# Patient Record
Sex: Female | Born: 1950 | Race: White | Hispanic: No | Marital: Single | Smoking: Current every day smoker
Health system: Southern US, Community
[De-identification: ages and names within clinical notes are randomized; demographics above are authoritative.]

## PROBLEM LIST (undated history)

## (undated) DIAGNOSIS — I10 Essential (primary) hypertension: Secondary | ICD-10-CM

## (undated) DIAGNOSIS — F329 Major depressive disorder, single episode, unspecified: Secondary | ICD-10-CM

## (undated) DIAGNOSIS — R569 Unspecified convulsions: Secondary | ICD-10-CM

## (undated) DIAGNOSIS — F419 Anxiety disorder, unspecified: Secondary | ICD-10-CM

## (undated) DIAGNOSIS — I639 Cerebral infarction, unspecified: Secondary | ICD-10-CM

## (undated) DIAGNOSIS — I509 Heart failure, unspecified: Secondary | ICD-10-CM

## (undated) DIAGNOSIS — J449 Chronic obstructive pulmonary disease, unspecified: Secondary | ICD-10-CM

## (undated) DIAGNOSIS — F32A Depression, unspecified: Secondary | ICD-10-CM

## (undated) HISTORY — PX: ABDOMINAL HYSTERECTOMY: SHX81

## (undated) HISTORY — PX: CHOLECYSTECTOMY: SHX55

---

## 1998-08-08 ENCOUNTER — Inpatient Hospital Stay (HOSPITAL_COMMUNITY): Admission: EM | Admit: 1998-08-08 | Discharge: 1998-08-09 | Payer: Self-pay | Admitting: Emergency Medicine

## 1998-11-11 ENCOUNTER — Ambulatory Visit (HOSPITAL_COMMUNITY): Admission: RE | Admit: 1998-11-11 | Discharge: 1998-11-11 | Payer: Self-pay | Admitting: Gastroenterology

## 1998-12-21 ENCOUNTER — Inpatient Hospital Stay (HOSPITAL_COMMUNITY): Admission: EM | Admit: 1998-12-21 | Discharge: 1998-12-23 | Payer: Self-pay

## 1998-12-29 ENCOUNTER — Encounter: Admission: RE | Admit: 1998-12-29 | Discharge: 1998-12-29 | Payer: Self-pay | Admitting: *Deleted

## 1999-06-02 ENCOUNTER — Encounter: Payer: Self-pay | Admitting: Emergency Medicine

## 1999-06-02 ENCOUNTER — Inpatient Hospital Stay (HOSPITAL_COMMUNITY): Admission: EM | Admit: 1999-06-02 | Discharge: 1999-06-08 | Payer: Self-pay | Admitting: Emergency Medicine

## 1999-06-06 ENCOUNTER — Encounter: Payer: Self-pay | Admitting: Cardiology

## 1999-08-08 ENCOUNTER — Encounter (HOSPITAL_COMMUNITY): Admission: RE | Admit: 1999-08-08 | Discharge: 1999-11-06 | Payer: Self-pay | Admitting: Cardiology

## 2000-06-06 ENCOUNTER — Encounter: Payer: Self-pay | Admitting: Cardiology

## 2000-06-06 ENCOUNTER — Inpatient Hospital Stay (HOSPITAL_COMMUNITY): Admission: EM | Admit: 2000-06-06 | Discharge: 2000-06-08 | Payer: Self-pay

## 2000-06-27 ENCOUNTER — Inpatient Hospital Stay (HOSPITAL_COMMUNITY): Admission: EM | Admit: 2000-06-27 | Discharge: 2000-07-02 | Payer: Self-pay | Admitting: Emergency Medicine

## 2000-06-27 ENCOUNTER — Encounter (INDEPENDENT_AMBULATORY_CARE_PROVIDER_SITE_OTHER): Payer: Self-pay | Admitting: *Deleted

## 2000-06-28 ENCOUNTER — Encounter: Payer: Self-pay | Admitting: Cardiovascular Disease

## 2000-06-29 ENCOUNTER — Encounter: Payer: Self-pay | Admitting: Cardiovascular Disease

## 2000-06-30 ENCOUNTER — Encounter: Payer: Self-pay | Admitting: Emergency Medicine

## 2000-07-24 ENCOUNTER — Emergency Department (HOSPITAL_COMMUNITY): Admission: EM | Admit: 2000-07-24 | Discharge: 2000-07-24 | Payer: Self-pay | Admitting: Emergency Medicine

## 2000-07-25 ENCOUNTER — Encounter: Payer: Self-pay | Admitting: Emergency Medicine

## 2000-07-25 ENCOUNTER — Inpatient Hospital Stay (HOSPITAL_COMMUNITY): Admission: EM | Admit: 2000-07-25 | Discharge: 2000-07-31 | Payer: Self-pay | Admitting: Endocrinology

## 2007-03-13 ENCOUNTER — Ambulatory Visit: Payer: Self-pay | Admitting: Thoracic Surgery (Cardiothoracic Vascular Surgery)

## 2007-04-30 ENCOUNTER — Ambulatory Visit: Payer: Self-pay | Admitting: Thoracic Surgery (Cardiothoracic Vascular Surgery)

## 2007-07-23 ENCOUNTER — Ambulatory Visit: Payer: Self-pay | Admitting: Thoracic Surgery (Cardiothoracic Vascular Surgery)

## 2007-08-26 ENCOUNTER — Ambulatory Visit: Payer: Self-pay | Admitting: Thoracic Surgery (Cardiothoracic Vascular Surgery)

## 2007-10-08 ENCOUNTER — Ambulatory Visit: Payer: Self-pay | Admitting: Thoracic Surgery (Cardiothoracic Vascular Surgery)

## 2008-11-22 ENCOUNTER — Inpatient Hospital Stay: Payer: Self-pay | Admitting: Internal Medicine

## 2010-10-12 ENCOUNTER — Ambulatory Visit: Payer: Self-pay | Admitting: Vascular Surgery

## 2010-10-20 ENCOUNTER — Ambulatory Visit (HOSPITAL_COMMUNITY): Admission: RE | Admit: 2010-10-20 | Discharge: 2010-10-20 | Payer: Self-pay | Admitting: Vascular Surgery

## 2010-10-20 ENCOUNTER — Ambulatory Visit: Payer: Self-pay | Admitting: Vascular Surgery

## 2010-12-07 ENCOUNTER — Ambulatory Visit: Payer: Self-pay | Admitting: Vascular Surgery

## 2011-03-14 LAB — POCT I-STAT, CHEM 8
BUN: 9 mg/dL (ref 6–23)
Chloride: 105 mEq/L (ref 96–112)
HCT: 39 % (ref 36.0–46.0)
Sodium: 141 mEq/L (ref 135–145)
TCO2: 28 mmol/L (ref 0–100)

## 2011-03-14 LAB — GLUCOSE, CAPILLARY
Glucose-Capillary: 102 mg/dL — ABNORMAL HIGH (ref 70–99)
Glucose-Capillary: 109 mg/dL — ABNORMAL HIGH (ref 70–99)
Glucose-Capillary: 110 mg/dL — ABNORMAL HIGH (ref 70–99)

## 2011-05-15 NOTE — Assessment & Plan Note (Signed)
OFFICE VISIT   SHAKILA, MAK K  DOB:  05-Jul-1951                                       10/12/2010  EAVWU#:[id number]   CHIEF COMPLAINT:  Leg pain.   HISTORY OF PRESENT ILLNESS:  The patient is a 60 year old female  referred by [facility] for evaluation of lower extremity  pain and weakness.  The patient states that her legs burn and give way  after walking approximately 20-30 yards.  She states this has been going  on for several years but slowly getting worse.  She also has noticed  some swelling in her right ankle which has occurred over the last few  months.  She currently smokes half a pack of cigarettes per day and is  not interested in quitting.  Other chronic medical problems include  diabetes, hypertension, elevated cholesterol, congestive failure and  COPD.  These are all chronic in nature and are currently controlled.  She does use oxygen at night time.  She denies any rest pain in her  foot.  She has no history of nonhealing ulcers in her feet.   PAST MEDICAL HISTORY:  Is otherwise noncontributory except for the fact  she has had multiple coronary stents.   PAST SURGICAL HISTORY:  She had a cholecystectomy.  She had an aortic  valve replacement with Dr. Arvilla Market at [facility].  She has also had a left  carotid stent that was placed 6-7 years ago and had a TIA event after  that.   SOCIAL HISTORY:  She is single.  Smoking history is listed above.  She  does not consume alcohol regularly.   FAMILY HISTORY:  Remarkable for her brother and sister, all of which  required aortic valve replacement and have had coronary stents.   REVIEW OF SYSTEMS:  Full 12 point review of systems was performed on the  patient today.  Please see intake referral form for details regarding  this.  To highlight though she has had several mini strokes in the past,  primarily involving her right side.  She also has a history of chest  pain and shortness of  breath.   MEDICATIONS:  Her medications include aspirin, Crestor, metformin,  Plavix, Nitrostat, Cartia, Dilantin, Trilipix, Nexium, gabapentin,  hydrocodone, Seroquel, diazepam and Cymbalta.   ALLERGIES:  She has allergies listed to penicillin and sulfa.   PHYSICAL EXAM:  Vital signs:  Blood pressure is 181/96 in the left arm,  heart rate is 93 and regular.  Temperature is 98.  HEENT:  Unremarkable.  Neck:  Has 2+ carotid pulses without bruit.  Chest:  Clear to  auscultation.  Cardiac:  Regular rate and rhythm without murmur.  Abdomen:  Soft, nontender, nondistended.  No masses.  Extremities: She  has 2+ radial pulses bilaterally.  She has a 1+ right femoral pulse.  She has absent left femoral pulse.  She has absent popliteal and pedal  pulses bilaterally.  Skin:  Has no open ulcers or rashes.  Neurologic:  Shows symmetric upper extremity and lower extremity motor strength which  is 5/5.  Musculoskeletal:  Exam shows no obvious major joint  deformities.  She does have some lateral swelling over the right ankle.   I reviewed several pages of medical records from [facility].  She has a history of lumbar radiculopathy and is on hydrocodone for  this  chronically.  She also has a history of peripheral neuropathy as well as  a seizure disorder and carotid disease with her most recent carotid  duplex exam in August of 2011 which showed no significant carotid  stenosis with a patent stent.  She did have suggestion of left  subclavian stenosis as well.   She had bilateral ABIs performed in our office today which were 0.73 on  the left, 0.94 on the right.  Waveforms were monophasic throughout the  left lower extremity suggesting iliac occlusive disease.   In summary, the patient has symptoms consistent with peripheral arterial  disease in both lower extremities with claudication type symptoms.  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  I had a lengthy  discussion with the patient today regarding smoking cessation.  She has  no interest in quitting at this time.  I also discussed with her the  risks of smoking combined with diabetes and again she is not interested  in quitting.  I have offered her an arteriogram with lower extremity  runoff to see if there is a percutaneous option for treating her iliac  occlusive disease to see if we could relieve her symptoms somewhat.  I  did advise her that she currently is not at risk of limb loss and we  could treat this conservatively.  However, she states that this is  lifestyle limiting enough that she wishes to pursue treatment at this  time.  We have scheduled her for arteriogram on 10/20/2010.  Risks,  benefits, possible complications and procedure details were explained to  the patient today including but not limited to bleeding, infection,  contrast reaction, vessel injury, also durability of any intervention  was described to the patient today.  I also described to her that the  durability may be further limited if she continues to smoke.  If this is  not amenable to percutaneous option in light of the fact that she has  fairly severe COPD I do not believe she would necessarily be a very good  operative candidate.  Hopefully we will be able to find a percutaneous  option for her.  She will continue her Plavix and her aspirin.  We will  stop her metformin before the procedure to make sure that she has no  reaction from the contrast interacting with this.     Janetta Hora. Fields, MD  Electronically Signed   CEF/MEDQ  D:  10/12/2010  T:  10/13/2010  Job:  [id number]   cc:   Ephriam Knuckles, PAC  Dani Gobble, [facility]  Aletha Halim

## 2011-05-15 NOTE — Assessment & Plan Note (Signed)
OFFICE VISIT   Lori Cooke, Lori Cooke  DOB:  1951/12/26                                       11/09/2010  OVFIE#:[id number]   The patient was scheduled for followup visit today after a recent  aortogram lower extremity runoff.  She was a no show for her office  visit today.     Janetta Hora. Fields, MD   CEF/MEDQ  D:  11/09/2010  T:  11/10/2010  Job:  [id number]

## 2011-05-15 NOTE — Procedures (Signed)
LOWER EXTREMITY ARTERIAL DUPLEX   INDICATION:  Claudication, per standing order.   HISTORY:  Diabetes:  Yes.  Cardiac:  CHF.  Hypertension:  Yes.  Smoking:  Yes.  Previous Surgery:  No.  Other:  Patient states that she gets left groin pain when she starts  walking.   SINGLE LEVEL ARTERIAL EXAM                          RIGHT                LEFT  Brachial:               161                  163  Anterior tibial:        144                  115  Posterior tibial:       153                  119  Peroneal:  Ankle/Brachial Index:   0.94                 0.73   LOWER EXTREMITY ARTERIAL DUPLEX EXAM   DUPLEX:  Monophasic Doppler waveforms noted throughout the left lower  extremity arterial system with no significantly increased velocities.   IMPRESSION:  1. Monophasic Doppler waveforms of the left lower extremity suggest      arterial disease at the left iliac artery level.  2. Moderately decreased left ankle brachial index with mildly      decreased right ankle brachial index.   ___________________________________________  Janetta Hora Fields, MD   CH/MEDQ  D:  10/12/2010  T:  10/12/2010  Job:  [id number]

## 2011-05-15 NOTE — Assessment & Plan Note (Signed)
OFFICE VISIT   Lori Cooke, Lori Cooke  DOB:  12-04-1951                                       11/09/2010  AOZHY#:[id number]   The patient was scheduled for followup visit today after a recent  aortogram lower extremity runoff.  She was a no show for her office  visit today.     Janetta Hora. Fields, MD  Electronically Signed   CEF/MEDQ  D:  11/09/2010  T:  11/10/2010  Job:  [phone]

## 2011-05-18 NOTE — H&P (Signed)
[facility]  Patient:    Lori Cooke, Lori Cooke                       MRN: [id number] Adm. Date:  [id number] Disc. Date: [id number] Attending:  Sandi Raveling                         History and Physical  REASON FOR ADMISSION:  Intractable vomiting.  HISTORY OF PRESENT ILLNESS:  The patient is a 60 year old woman who had a cholecystectomy one month ago.  She states that she has had persistent diarrhea since then.  She was seen in the Emergency Room of [facility] yesterday with abdominal pain as well as nausea and vomiting.  At that time, CBC was normal. CT of the abdomen showed a question of a cecal abnormality, and cholecystectomy was advised on a routine basis for follow-up.  The patient received parental antinausea medication, but it is the patients report that this did not improve her symptoms at all and she continued to vomit.  PAST MEDICAL HISTORY: 1. Elevated triglycerides. 2. Hypertension. 3. Cigarette smoking. 4. Anxiety. 5. Noncardiogenic chest pain, January 1999.  She had a normal Cardiolite with     exertion at that time. 6. The patient has a history of ectasia of the aorta.  MEDICATIONS: 1. Aspirin 81 mg daily. 2. Prevacid 30 mg twice a day. 3. Paxil 20 mg a day. 4. Lopressor 50 mg twice a day. 5. Lopid 600 mg twice a day.  FAMILY HISTORY:  No one else at home is ill.  SOCIAL HISTORY:  The patient is disabled.  She lives in [place].  She is single.  REVIEW OF SYSTEMS:  The patient denies the following:  Fever, loss of consciousness, shortness of breath, chest pain, bright red blood per rectum, jaundice, dysuria, hematuria, arthralgias, and skin rash.  It should be noted that the patient also had some epigastric pain and is not certain if this can be distinguished from chest pain.  PHYSICAL EXAMINATION:  VITAL SIGNS:  Blood pressure 120/70, heart rate is 88, temperature 98.3.  GENERAL:  In moderate distress secondary  to vomiting.  SKIN:  No diaphoretic.  There is no rash.  HEENT:  The head is atraumatic.  Sclerae nonicteric.  Pharynx is clear.  NECK:  Supple.  CHEST:  Clear to auscultation except for a few diffuse wheezes.  CARDIOVASCULAR:  No JVD.  No edema.  Regular rate and rhythm.  No murmur.  ABDOMEN:  Soft, minimal diffuse tenderness.  Not distended.  Bowel sounds are active.  RECTAL:  Examination not repeated today, as the patient reports it was hemoccult negative last night in the Emergency Department at [facility].  EXTREMITIES:   No obvious deformities of the joints.  NEUROLOGICAL:  Alert, well oriented.  Moves all fours and cranial nerves are grossly intact.  IMPRESSION: 1. Intractable nausea and vomiting. 2. Diarrhea since surgery one month ago and Clostridium difficile must    be excluded.  PLAN: 1. Admit to [facility]. 2. Check stool for Clostridium difficile. 3. Symptomatic therapy for nausea. 4. Electrocardiogram. 5. Recheck laboratory studies. 6. Full code at the patients request, but the patient states she would not    want to be maintained on artificial life support systems if there was no    reasonable chance of a functional recovery. DD:  07/25/00 TD:  07/27/00 Job: [id number]

## 2011-05-18 NOTE — Consult Note (Signed)
[facility]  Patient:    Lori Cooke, Lori Cooke                       MRN: [id number] Proc. Date: 07/27/00 Adm. Date:  [id number] Disc. Date: [id number] Attending:  Sandi Raveling CC:         Titus Dubin. Alwyn Ren, M.D. [facility]             Jimmye Norman, M.D.             [name], M.D.                          Consultation Report  DATE OF BIRTH:  July 01, 1951  REASON FOR CONSULTATION:  Abdominal pain and bloody diarrhea.  ASSESSMENT: 1. Right lower quadrant periumbilical pain with bloody diarrhea improved on    Cipro or Flagyl.  Question infectious versus inflammatory colitis. 2. Atherosclerotic coronary artery disease. 3. History of a hiatal hernia with gastritis diagnosed on    esophagogastroduodenoscopy by Dr. Melvia Heaps done last year. 4. Status post cholecystectomy on June 26, 2000 by Dr. Jimmye Norman. 5. Status post hysterectomy for uterine prolapse 20 years ago. 6. Three normal vaginal deliveries ______ . 7. History of tobacco abuse. 8. Family history of colon cancer (father died of colon cancer at 96).  The    patient gives a history of having colonoscopy done last year, question    results. 9. History of depression.  RECOMMENDATIONS: 1. Agree with IV antibiotics for now. 2. The patient may require repeat colonoscopy if there was not adequate    visualization of the right colon on the colonoscopy done last year. 3. Avoid all nonsteroidals for now. 4. Advance to soft, low residue diet for now.  DISCUSSION:  The patient is a 60 year old white female with the above mentioned problems who presented to [facility] two days ago with blood diarrhea, averaging 7-8 loose, bloody bowel movements per day and right lower quadrant pain.  This is the first time she has had symptoms like this. She had cholecystectomy last month for what she describes as chest pain.  She said the chest pain symptoms have improved since the cholecystectomy,  but cannot tell me for sure whether she had gallstones or biliary dyskinesia.  She had a CT scan of the abdomen and pelvis on admission that showed some thickening of the cecum and the ascending colon with a question of infectious versus inflammatory colitis.  The patient was started on IV Cipro and Flagyl and seems to have greatly improved on this medication.  She had 6-7 loose bowel movements yesterday, but none since last night and this morning.  Her abdominal pain is also considerably improved.  She denies a history of ulcer, jaundice or colitis.  Her appetite is somewhat decreased.  There is no nausea or vomiting.  She denies any genitourinary or cardiorespiratory complaints at this time.  There are no musculoskeletal or vascular problems.  The patient denies a history of recent travel, antibiotic use or use of well water.  ALLERGIES:  No known drug allergies.  MEDICATIONS: 1. Protonix 40 mg q.d. 2. Paxil 20 mg q.d. 3. Lopressor 50 mg b.i.d. 4. Flagyl 500 mg b.i.d. 5. Cipro 400 mg IV q.12h. 6. Phenergan p.r.n.  PAST MEDICAL HISTORY:  See the list above.  SOCIAL HISTORY:  The patient is divorced.  She is a Child psychotherapist.  She is now on  disability for the above mentioned problems.  She is a resident of [address] She has three grown children.  She has been smoking since the age of 43 and averages 1 to 1-1/2 packs per day for several years.  She drinks alcohol occasionally, but denies the use of street drugs.  FAMILY HISTORY:  The patients mother did of congestive heart failure at 54. Her father died of colon cancer at 59.  He was diagnosed at 73.  She has three brothers and two sisters.  There is a strong history of hypertension, heart disease and diabetes in their family.  Her oldest brother died of an MI at 80. She has a brother who has had several MIs in the past.  She has two sisters with heart disease.  There is no family history of breast, ovarian, uterine or prostate  cancer.  PHYSICAL EXAMINATION:  GENERAL:  Very pleasant, cooperative, middle-aged female in no acute distress.  VITAL SIGNS:  Stable.  She is afebrile.  HEENT:  TMs clear bilaterally.  ______ .  NECK:  Supple.  No JVD, thyromegaly or lymphadenopathy.  CHEST:  Clear to auscultation.  S1 and S2 regular.  Probable systolic murmur present.  No rales, rhonchi or wheezing.  ABDOMEN:  Soft with well healed surgical scars present from recent cholecystectomy and previous hysterectomy.  There is right lower quadrant tenderness on palpation with guarding.  No rebound or rigidity or hepatosplenomegaly.  No masses palpable.  RECTAL:  Deferred, as the patient already had one done by the ER physician.  LABORATORY DATA:  White count 8000, hemoglobin 12.6 g/dl, hematocrit 24.4, MCV 01.0, platelets 224.  Sodium 143, potassium 4.6, chloride 106, CO2 26, glucose 138, BUN 9, creatinine 0.9, calcium 9.8, total protein 6, albumin 3.4,  AST 21, ALT 14, alkaline phosphatase 79, total bilirubin 0.7, direct bilirubin 0.1.  CK-MBs were normal.  Liver profile showed cholesterol of 154, triglycerides 282 and HDL of 23.  C. difficile toxin assay was negative.  Considering this data, recommendations have been made.  Further recommendations may be made following examination by Dr. Marga Melnick. Thank you for the consultation. DD:  07/27/00 TD:  07/29/00 Job: [id number]

## 2011-05-18 NOTE — Procedures (Signed)
[place]  Patient:    Lori Cooke, Lori Cooke                       MRN: [id number] Proc. Date: 06/30/00 Adm. Date:  [id number] Attending:  Virgina Evener CC:         Lennette Bihari, M.D.             [name], M.D.                           Procedure Report  HISTORY:  Ms. Hollman is a 60 year old female admitted with chest pain.  She has coronary artery disease.  Cardiac cath showed patent vessels.  Ultrasound was normal.  She has a history of esophagitis.  She has been taking PPI therapy chronically.  Test was performed to rule out upper GI pathology.  INFORMED CONSENT:  The patient provided consent after risks, benefits, and alternatives were explained.  MEDICATIONS:  Versed 6, fentanyl 50, _______ 0.2 mg IV, and Cetacaine spray.  PROCEDURE IN DETAIL:  The patient was placed in the left lateral decubitus position and administered continuous low-flow oxygen and was placed on pulse oximetry.  The Olympus video gastroscope was inserted under direct vision into the oropharynx and esophagus.  FINDINGS:  Normal esophagus, stomach and duodenum.  IMPRESSION: 1. Normal upper endoscopy. 2. Chest pain of unclear etiology.  RECOMMENDATION: 1. Continue PPI therapy (Protonix 40 mg a day). 2. No further GI workup. DD:  06/30/00 TD:  06/30/00 Job: [id number]

## 2011-05-18 NOTE — Consult Note (Signed)
[place]  Patient:    Lori Cooke, Lori Cooke                       MRN: [id number] Proc. Date: 07/01/00 Adm. Date:  [id number] Attending:  Virgina Evener Dictator:   [id number] CC:         Dr. Tresa Endo             Dr. Russella Dar                          Consultation Report  REASON FOR CONSULTATION: I very much thank Dr. Russella Dar for asking me to see Lori Cooke, a 60 year old female with possible acalculus cholecystitis.  HISTORY OF PRESENT ILLNESS: The patient had been recently admitted for rule out myocardial ischemia and epigastric pain.  An ultrasound done on admission on June 29, 2000 demonstrated no evidence of acute disease; however, a HIDA scan was done today which demonstrated nonfilling of the gallbladder.  The patient was found to have right upper quadrant tenderness on examination, although her WBC was normal and liver function tests were normal.  There were no other findings to explain the patients abdominal pain in the epigastrium and right upper quadrant.  In spite of normal liver function tests with positive vascular history and history of multiple pain medications she was thought possibly to have acalculus cholecystitis.  She had a normal HIDA scan previously and an abnormal one now and the diagnosis was ruled in and surgical consultation obtained.  PHYSICAL EXAMINATION:  VITAL SIGNS: On my examination the patient is afebrile with other vital signs stable.  ABDOMEN: She complains of tenderness in the right upper quadrant and has a positive Murphys sign.  IMPRESSION: With these findings in spite of the normal WBC and normal LFTs it is possible that she does have acalculus cholecystitis and she will be set up for laparoscopic cholecystectomy as soon as possible.  RECOMMENDATIONS: It does seem a little bit unusual that the patient can have acalculus cholecystitis with a normal WBC, normal left shift, no fever, and normal liver function tests;  however, with positive HIDA scan unless it is falsely positive I can see no other reason except for acute gallbladder disease or biliary ductal obstruction.  Laparoscopic cholecystectomy will be scheduled as soon as possible. DD:  07/01/00 TD:  07/02/00 Job: [id number]

## 2011-05-18 NOTE — Cardiovascular Report (Signed)
[place]. [facility]  Patient:    Lori Cooke, Lori Cooke                       MRN: [id number] Proc. Date: 06/07/00 Adm. Date:  [id number] Disc. Date: [id number] Attending:  Ophelia Shoulder CC:         [name], M.D.                        Cardiac Catheterization  PROCEDURES PERFORMED: 1. Intracoronary artery stenting of the mid left circumflex coronary    artery. 2. Selective coronary angiography by Judkins technique. 3. Retrograde left heart catheterization. 4. Left ventricular angiography. 5. Aortic root angiography.  COMPLICATIONS:  None.  ENTRY SITE:  Right femoral.  DYE USED:  Omnipaque.  MEDICATIONS GIVEN:  Phenergan 25 mg followed by 12.5 mg later in addition to ReoPro as a bolus and then a constant infusion and intravenous heparin by a weight-adjusted nomogram, and a labetalol 20 mg IV for elevated blood pressure.  PATIENT PROFILE:  The patient is a 60 year old female who is very anxious, who is under treatment for depression, who has known aortic regurgitation with a aortic ectasia of the ascending aorta as well as mild coronary disease.  She presented to the hospital June 06, 2000, with chest pain and was placed on IV nitroglycerin.  Her cardiac enzymes and ECGs have been negative for acute MI. Today she enters the cardiac catheterization lab electively on an inpatient basis for further coronary artery definition.  RESULTS:  PRESSURES:  The left ventricular pressure was 133/11, central aortic pressure 126/70, mean of 90.  The aortic valve gradient was 7 mmHg by pullback technique.  ANGIOGRAPHIC RESULTS:  The left main coronary artery was normal.  The left anterior descending coronary artery coursed to the cardiac apex was tortuous in its midportion but showed no lesions.  An optional diagonal branch arising very near the origin of the LAD contained a 40% ostial stenosis.  This vessel was medium in size.  The left circumflex  coronary artery was a relatively large vessel giving rise to a first obtuse marginal branch, a second obtuse marginal branch and then the mid circumflex itself coursed through the atrial ventricular groove. The midportion of the circumflex was noted to have an 80% stenosis which was seen to best advantage in an LAO projection.  The working view used for the percutaneous intervention study was a 7 degree RAO projection with 0 degrees of tilt.  The right coronary artery was the dominant vessel of the circulation.  It contained a 50% stenosis in the midportion of the vessel.  The distal runoff of this vessel was normal.  The left ventricle contracted normally.  No wall motion abnormalities were detected.  There was a fleck of calcification involving the aortic leaflets.  The aortic root angiogram showed marked ectasia of the ascending aorta and moderate aortic regurgitation.  The percutaneous coronary intervention was performed with a 7 French sheath in the right groin with a bolus of both heparin and ReoPro on a weight-adjusted basis.  The ACT was confirmed in the mid 200s before we began the intervention.  The guide catheter used was a 7 [place] 4.0 Voda guide catheter which was an excellent choice.  The guidewire used was a 0.014 luge wire by [employer].  The direct stenting procedure was performed using a 3.0 x 12 mm NIR Elite stent.  Two inflations were  performed.  The peak inflation pressure was 12 atmospheres corresponding to an estimated and anticipated transluminal diameter of 3.3.  The lesion of 80% was reduced to 0p residual stenosis with TIMI class 3 distal flow at the conclusion of the case. The patient experienced both chest and neck pain of a severe degree with balloon inflations.  No complications occurred.  The patient tolerated the procedure very well.  FINAL DIAGNOSES: 1. Successful intracoronary artery stenting of the mid circumflex with    reduction of an  80% lesion to 0% residual stenosis. 2. Mild coronary artery disease consisting of scattered lesions in the mid    right coronary artery, ostium of the first diagonal branch of the left    anterior descending and proximal circumflex.  See description above. 3. Normal left ventricular systolic function. 4. Aortic root ectasia. 5. Moderate aortic regurgitation.  PLAN:  The patient will be kept on ReoPro for 12 more hours and her beta blockers and Plavix will be continued. DD:  06/07/00 TD:  06/11/00 Job: [id number]

## 2011-05-18 NOTE — Op Note (Signed)
[facility]  Patient:    Lori Cooke, Lori Cooke                       MRN: [id number] Proc. Date: 07/01/00 Adm. Date:  [id number] Attending:  Virgina Evener CC:         Lennette Bihari, M.D.             Venita Lick. Pleas Koch., M.D. [facility]                           Operative Report  PREOPERATIVE DIAGNOSIS:  Acalculus cholecystitis.  POSTOPERATIVE DIAGNOSIS:  Possible chronic cholecystitis.  No evidence of acute disease.  PROCEDURE:  Laparoscopic cholecystectomy.  SURGEON:  Jimmye Norman, M.D.  ASSISTANT:  None.  ANESTHESIA:  General endotracheal.  ESTIMATED BLOOD LOSS:  Less than 20 cc.  COMPLICATIONS:  None.  CONDITION:  Stable.  FINDINGS:  A normal looking gallbladder with no evidence of acute disease.  SPECIMEN:  Gallbladder.  INDICATIONS FOR OPERATION:  The patient is a 60 year old woman who has been plaqued with upper abdominal and right upper quadrant pain.  She recently had a HIDA scan which demonstrated ______ on the gallbladder and a surgical consultation was obtained.  HIDA SCAN:  Nonvisualization of the gallbladder, acalculus cholecystitis should be considered.  OPERATION:  The patient was taken to the operating room and placed on the table in the supine position.  After an adequate general anesthetic was administered she was prepped and draped in the usual sterile manner exposing the midline and the right upper quadrant of the abdomen.  A supraumbilical curvilinear incision was made and taken down to the midline fascia.  It was through this fascia that a Verres needle was passed into the perineal cavity.  Once this was done and confirmed to be in adequate position using a saline test a 10-11 mm trocar ______ was passed into the supraumbilical fascia into the peritoneal cavity and confirmed to be in the adequate position using the laparoscope with attached camera light source. Subsequently two right costal margin 5 mm cannulas  and a subxiphoid 10-11 mm cannula was passed into the perineal cavity under direct vision.  With all cannulas in place the patient was placed in ______ reverse Trendelenburg position.  The left side was tilted down and the dissection begun.  It appeared to be a normal gallbladder and it was grasped and retracted into the right upper quadrant.  We identified the infundibulum, dissected out the cystic duct, and the cystic artery and the Triangle of [place].  We endoclipped proximally and distally using endoclips and then transected both the cystic duct and artery.  We dissected the gallbladder out of the hepatic bed using electrocautery with minimal bleeding.  We subsequently got the gallbladder out through the supraumbilical fascia with minimal difficulty.  There was minimal bleeding.  We irrigated with saline.  There was no bilious staining.  We closed the abdomen after removing all cannulas.  The supraumbilical fascia was closed using a figure-of-eight stitch of 0 Vicryl and a ______ 6 needle.  The skin and ______sites were closed using running subcuticular stitch of 4-0 Vicryl.  All needle counts, sponge counts and instrument counts were correct. Sterile dressings were applied to all wounds.  Then 0.25% Marcaine was injected in all incisions. DD:  07/01/00 TD:  07/02/00 Job: [id number]

## 2011-05-18 NOTE — Discharge Summary (Signed)
. [facility]  Cooke:    Lori Cooke, Lori Cooke                       MRN: [id number] Adm. Date:  [id number] Disc. Date: 07/03/00 Attending:  Virgina Cooke Dictator:   Lori Cooke, P.A.-C. CC:         Lori Cooke, M.D.             Lori Cooke, M.D. [facility]             Lori Cooke, M.D.             Lori Cooke. Eda Keys., M.D. [facility]                           Discharge Summary  ADMITTING DIAGNOSES:  1. Chest pain.  2. History of coronary artery disease.  3. History of tobacco abuse.  4. History of hyperlipidemia.  5. History of depression.  6. Aortic insufficiency.  7. Hypertension.  DISCHARGE DIAGNOSES:  1. Chest pain.  2. History of coronary artery disease.  3. History of tobacco abuse.  4. History of hyperlipidemia.  5. History of depression.  6. Aortic insufficiency.  7. Hypertension.  8. Status post cardiac catheterization on June 27, 2000, with no significant     coronary artery disease or restenosis.  9. Status post gastrointestinal consults on June 28, 2000, status post     esophagogastroduodenoscopy on June 30, 2000, which was negative. 10. Status post HIDA scan on July 01, 2000. 11. Status post laparoscopic cholecystectomy by Dr. Lindie Spruce on July 01, 2000.  HISTORY OF PRESENT ILLNESS:  Lori Cooke is a 60 year old married white female with known CAD and chronic renal failure, and tobacco abuse which is ongoing. As well, she has a history of hyperlipidemia and hypertension.  Her recent cardiac catheterization on June 07, 2000, with PTCA and stent of Lori mid circumflex, went from an 80% to a 0% residual.  There was noncritical disease of Lori RCA and LAD.  Normal LV function.  She presented on June 27, 2000.  She described her onset of substernal chest pain four days prior.  It was a crescendo angina.  She stated that there it was 7/10, fairly constant, with relief of nitroglycerin.  She had used five nitroglycerins over Lori last  four days.  Her activities exacerbated Lori angina. She had radiation through to her back.  As well, she had nausea and vomiting, shortness of breath, and diaphoresis.   She says that this is Lori same pain that she had had prior to her angioplasty of June 07, 2000.  On exam at that time, her blood pressure was 124/80.  Pulse was 62.  Her lungs were clear.  Her heart was a regular rhythm with a 2/6 systolic murmur.  EKG revealed sinus rhythm with T wave inversions in V1 through V3 which was changed since October 2000.  At that time, it was felt that she might be experiencing unstable angina.  She was planned for admission to telemetry to rule out MI with serial enzymes. She was placed on IV heparin, IV nitroglycerin, aspirin, and Plavix in addition to her current medications.  She is planned for cardiac catheterization that day.  HOSPITAL COURSE:  On June 27, 2000, Lori Cooke underwent cardiac catheterization by Dr. Nicki Cooke.  Please see his dictated catheterization report for further details.  She was found  to have nonobstructive CAD.  On June 28, 2000, Lori Cooke continued to have complaints of mid sternal chest pain with radiation to Lori back.  Lori pain is increased while walking. Enzymes have been negative x 3.  At that time, we plan to obtain GI consultation.  It was felt that she might be experiencing esophageal spasm. We will rule out dissection with chest CT.  We plan to hold Plavix and started proton pump inhibitor.  On June 28, 2000, chest CT was ordered to rule out aortic dissection.  This revealed no evidence for dissection of aorta or pulmonary embolic disease.  On June 28, 2000, GI consultation was obtained.  Lori Cooke had seen Dr. Marina Goodell in Lori past and his group was consulted.  At that time, they say that Lori Cooke had a history of duodenitis.  They felt that they needed to rule out gallbladder disease despite normal LFTs and normal prior ultrasound. At that tie,  they planned for an abdominal ultrasound in Lori morning.  Abdominal ultrasound was negative for gallbladder disease.  GI then proceeded with obtaining an EGD.  EGD was performed and was found to be normal.  At that time, Lori Cooke was planned for a HIDA scan.  This was performed on June 30, 2000.  It was an abnormal hepatobiliary scan with nonvisualization of Lori gallbladder.  Acalculous cholecystitis should be considered.  At that time, given Lori results of Lori HIDA scan, GI planned for surgery consultation.  Lori Cooke was then planned for a laparoscopic cholecystectomy.  On July 01, 2000, laparoscopic cholecystectomy was performed by Dr. Lindie Spruce. There were no complications and Lori Cooke tolerated Lori procedure well. However, on findings, there was no acute disease and it was a normal-appearing gallbladder.  On July 02, 2000, Lori Cooke is complaining of some slight nausea.  Otherwise, she is feeling well and her nausea is actually improved.  She is afebrile at 98.1, has a pulse of 76, blood pressure 106/68, respirations 20, oxygen saturation 97% on room air.  Her right groin from catheterization has no ecchymosis or hematoma.  Lori sites of Lori laparoscopic cholecystectomy have no bleeding or drainage and appear stable.  She has been seen by cardiology by Dr. Alanda Amass and has also been seen by Dr. Lindie Spruce for surgery and is felt to be stable for discharge home from both.  HOSPITAL CONSULTS:  1. Gastroenterology consultation with Dr. Russella Dar on June 28, 2000.  2. Surgery consult for Dr. Lindie Spruce on July 01, 2000.  HOSPITAL PROCEDURES:  1. Cardiac catheterization on June 27, 2000, revealing nonobstructive     coronary artery disease.  2. Chest CT on June 29, 20001.  This revealed:     a. Unchanged ectasia of Lori ascending aorta when compared with a prior CT        scan from June 06, 1999.  Again, to be considered would be cystic medial        necrosis versus aortic valve stenosis.       b. No evidence for dissection of Lori aorta or pulmonary embolic disease.        Note, this scan was not performed with pulmonary embolus protocol.  3. Tiny bilateral effusions with right base atelectasis.  4. Ultrasound of Lori abdomen on June 29, 2000.  This was a normal abdominal     ultrasound.  5. Hepatobiliary liver function scan on June 30, 2000.  This is an abnormal     hepatobiliary scan with nonvisualization of  Lori gallbladder.  Acalculous     cholecystitis should be considered.  Lori ultrasound on June 29, 2000,     appeared normal, however.  It was considered, for Lori Cooke who had been     given any pain medication, it may create a false-positive scan.  6. Electrocardiogram revealed normal sinus rhythm with some T wave inversions     lead V1 through V3.  HOSPITAL LABORATORIES:  TSH normal at 1.268.  Lipid profile reveals cholesterol 239, triglyceride 328, HDL 24, LDL 149.  Cardiac enzymes are negative for CKs of 34, 30, and 25.  CK-MBs are 0.3 and less than 0.3 x 2. Troponin is 0.40.  Liver function tests are normal throughout Lori hospitalization.  As well, metabolic profile is normal throughout Lori hospitalization.  Sodiums were 36, potassium 3.6, glucose 115, BUN 19, creatinine 0.8.  It remained in that range.  As well, ALT 16, ALP 88, total bilirubin 0.8, magnesium 1.9, amylase 67, lipase 29.  All of these are normal limits.  CBC is normal with WBC 6.6, hemoglobin 13.2, hematocrit 36.4, platelets 309.  DISCHARGE MEDICATIONS:  1. Vicodin 5/500 one to two p.o. q.4-6h. p.r.n.  She is given #8 with one     refill.  2. Phenergan 25 mg suppositories.  One per rectum q.4-6h. p.r.n. nausea.  She     is given #8 with three refills.  3. Protonix 40 mg one p.o. b.i.d.  4. Imdur 30 mg one p.o. q.d.  5. Lipitor 10 mg one p.o. q.d.  6. Xanax 0.5 mg one p.o. b.i.d.  7. Metoprolol 50 mg one p.o. b.i.d.  8. Lopid 600 mg one p.o. b.i.d.  9. Paxil 20 mg one p.o. q.d.  She is  instructed not to take her aspirin for about three days and then to resume.  As well, she is to restart her Plavix 75 mg once a day in two days. This is to prevent any bleeding status post her laparoscopic cholecystectomy.  ACTIVITY:  As instructed by Dr. Lindie Spruce.  DIET:  Low-salt, low-fat diet.  She is to have a bland diet for approximately one week postoperatively.  WOUND CARE:  She is to call Dr. Lindie Spruce if she has any problems with her incision sites.  Her phone number is [phone].  As well, she is to call Dr. Lindie Spruce at [phone] to make an appointment to see him in two weeks.  FOLLOW-UP:  Follow up with Dr. Elsie Lincoln August 7 with at 4:15 p.m.  Call our office at [phone] if any problems. DD:  07/02/00 TD:  07/02/00 Job: [id number]

## 2011-05-18 NOTE — Discharge Summary (Signed)
[facility]  Patient:    Lori Cooke, Lori Cooke                       MRN: [id number] Adm. Date:  [id number] Disc. Date: [id number] Attending:  Dolores Patty                           Discharge Summary  FINAL DIAGNOSES: 1. Clostridium-negative pseudomembranous colitis. 2. Intractable nausea and vomiting secondary to #1. 3. Hypertension.  HISTORY OF PRESENT ILLNESS:  The patient is a 60 year old white female status post cholecystectomy about one month prior to admission.  Since her surgery, she has had persistent diarrhea.  On the day of admission, she was admitted with abdominal pain and intractable nausea and vomiting.  Evaluation in the ER revealed a normal CBC.  CT of the abdomen revealed a cecal abnormality, and the patient was admitted for further evaluation and treatment.  LABORATORY DATA AND HOSPITAL COURSE:  In the hospital, the patient was seen in consultation by the gastroenterology service.  Stool for C. difficile toxin was negative.  The patient was initially treated with IV Cipro and Flagyl for suspected infectious colitis.  It was felt that this most likely represented a pseudomembranous colitis, and later in the hospital course, the Cipro was discontinued.  She continued to improve on Flagyl.  Laboratory studies included serial CBCs which continued to show normal white blood cell count, hemoglobin 11.4, hematocrit 32.4.  Chemistries were normal. Glycosylated hemoglobin was 4.6.  The patient did exhibit some mild stress hyperglycemia during the hospital course.  Cardiac enzymes were negative.  Electrocardiogram revealed a normal sinus rhythm and nonspecific ST-T wave changes.  During the hospital period, she was maintained on Protonix, Paxil, Lopressor, and was treated with prophylactic percutaneous heparin.  Hospital course was marked by a steady clinical improvement.  At the time of discharge, she was tolerating a regular diet with no  significant abdominal pain, and her bowel habits had normalized.  DISPOSITION:  The patient will be discharged today.  DISCHARGE MEDICATIONS:  She is to complete a 10-day course of Flagyl 250 mg q.i.d.  She will be discharged on her preadmission medications of: 1. Aspirin 81 mg. 2. Prevacid 30 mg daily. 3. Paxil 20 mg daily. 4. Lopressor 50 mg b.i.d. 5. Lopid 600 mg b.i.d.  FOLLOWUP:  She has been asked to follow up with her primary care physician, Dr. Romero Belling within the next two weeks.  CONDITION OF DISCHARGE:  Improved. DD:  07/31/00 TD:  08/01/00 Job: [id number]

## 2011-05-18 NOTE — Cardiovascular Report (Signed)
. [facility]  Patient:    Lori Cooke, Lori Cooke                       MRN: [id number] Proc. Date: 06/27/00 Adm. Date:  [id number] Attending:  Cathren Laine CC:         [name], M.D.             Lenise Herald, M.D.             Lennette Bihari, M.D.             Orville Govern, CVTS             Justine Null, M.D. [facility]                        Cardiac Catheterization  INDICATIONS:  [name] is a 60 year old white female, with known coronary artery disease, history of tobacco abuse, hyperlipidemia and hypertension.  Recent cardiac catheterization on June 07, 2000 revealed high-grade circumflex marginal stenosis, for which she underwent PTCA/stenting from 80% to 0%.  She also had 50% disease in her RCA.  The patient had done well, but for the past four days has noticed recurrent episodes of substernal chest pressure, for which she is taking nitroglycerin.  She was seen in the office today by Dr. Jenne Campus.  An ECG showed suggestion of new T-wave changes in leads V1 and V2.  She is admitted to [facility], heparinized, given IV nitroglycerin and scheduled for catheterization.  HEMODYNAMIC DATA: 1. Central aortic pressure:  129/69. 2. Left ventricular pressure:  130/14.  ANGIOGRAPHIC DATA: 1. Left Main Coronary Artery:  Angiographically normal. 2. Left Anterior Descending Coronary Artery:  Had mild 20% narrowing.    There appears to be mild thinning of the contrast in the proximal    small septal perforator, but no stenosis was visualized in this    region.  The first diagonal branch was normal.  The remainder of the    LAD was angiographically normal. 3. Circumflex:  The proximal circumflex vessel had mild 20% narrowing    before the first marginal branch.  The second marginal branch was a    large-caliber vessel.  There was no evidence for any restenosis at the    stented segment.  Residual narrowing was 0%. 4.  Right Coronary Artery:  Had diffuse, proximal to mid disease, with    stenoses of 50% in the proximal mid segment; and also 50-70% further    down in the mid segment proximal to the crux.  There was 30% narrowing    in the anterior LV marginal branch.  LEFT VENTRICULOGRAPHY:  Biplane cineangiographic left ventriculography revealed normal global LV function, without focal segmental wall motion abnormalities.  There was mild calcification of the aortic valve.  It was very difficult to cross the aortic valve, and the valve was unable to be crossed by the pigtail.  An exchange technique was necessary, and the valve was crossed with the right coronary artery and also exchanged for a pigtail catheter.  DISTAL AORTOGRAPHY:  This was then performed following ventriculography, since the aortic root appeared widened.  There was evidence for aortic root dilatation.  There was no evidence for dissection.  There was 1-2+ angiographic aortic regurgitation.  IMPRESSION: 1. Normal LV function. 2. Aortic valve sclerosis, with 1-2+ angiographic aortic regurgitation. 3. Mild aortic root dilatation, with no evidence for dissection.  4. No restenosis at the PTCA/stent site in the circumflex marginal vessel;    with residual narrowing of 20% in the proximal circumflex and 20%    in the proximal LAD. 5. There is 50-70% stenoses diffusely in the mid right coronary artery, not    significantly changed from the prior study of June 07, 2000.  RECOMMENDATION:  Increased medical therapy trial. DD:  06/27/00 TD:  06/28/00 Job: [id number]

## 2011-05-18 NOTE — Discharge Summary (Signed)
[place]  Patient:    Lori Cooke, Lori Cooke                       MRN: [id number] Adm. Date:  [id number] Disc. Date: [id number] Attending:  Dolores Patty Dictator:   Marya Fossa, P.A. CC:         Dr. Meryl Crutch, M.D.                           Discharge Summary  DATE OF BIRTH:  1951/07/19  ADMITTING PHYSICIAN:  Dr. Nicki Guadalajara.  DISCHARGING PHYSICIAN:  Dr. Nanetta Batty  ADMITTING DIAGNOSES: 1. Chest pain, question angina. 2. History of noncritical coronary artery disease. 3. Ongoing tobacco abuse. 4. Depression. 5. Mild aortic insufficiency. 6. Hyperlipidemia.  DISCHARGE DIAGNOSES: 1. Chest pain resolved status post percutaneous coronary intervention, in    stenosis mid circumflex, myocardial infarction ruled out. 2. History of noncritical coronary artery disease. 3. Ongoing tobacco abuse. 4. Depression. 5. Mild aortic insufficiency. 6. Hyperlipidemia.  HISTORY OF PRESENT ILLNESS:  Lori Cooke is a 60 year old white female patient of Dr. Elsie Lincoln and Dr. Everardo All with a history of noncritical CAD - 50% circumflex in June 2000, mild AI with bicuspid aortic valve, hypertension, hyperlipidemia, and ongoing tobacco abuse.  She was seen and admitted to [facility] June 05, 2000 with complaints of chest pain, nausea, shortness of breath, and diaphoresis.  Enzymes were negative, and EKG was negative.  Dr. Christella Hartigan, the hospitalist, tried to wean her off nitroglycerin, but her chest pain would increase.  She was therefore transferred to [facility] for further evaluation.  She was seen in the emergency room, and the patient continued to complain of anterior and posterior chest pain ______ IV nitroglycerin at 16 cc.  She complains of shortness of breath and nausea.  She did not appear to be in distress.  EKG was negative.  She states that this was the same type of pain that she has had in the past  but stronger.  It is worse with breathing.  Apparently the onset of her pain was Tuesday on June 5, while walking in the yard.  She felt funny.  She laid down and developed substernal and left chest pain which was stabbing and radiating to back x 30 minutes.  She took two sublingual nitroglycerin with some relief.  She went to sleep and woke up with recurrent symptoms, and therefore, went to [facility] on June 6.  The patient will now be admitted to [facility] for atypical chest pain which was nitrate responsive.  Plan cardiac catheterization.  Will continue IV nitroglycerin, IV heparin, beta-blockers, aspirin, and Plavix.  PROCEDURES:  Cardiac catheterization June 07, 2000 by Dr. Tonita Cong.  COMPLICATIONS:  None.  CONSULTATIONS:  None.  HOSPITAL COURSE:  Lori Cooke was admitted on IV heparin and IV nitroglycerin and medications as described.  Her laboratory studies were reviewed from [facility] and showed enzymes that were negative x 3.  White blood cell 10.6, hemoglobin 12.8, platelets 236.  Sodium 140, potassium 4.5, BUN 13, creatinine 0.7.  Total cholesterol 332, triglycerides 500, HDL 52.  The patient was taken to the cardiac catheterization lab on June 07, 2000 by Dr. Chanda Busing.  This revealed a normal left main, left circumflex with 30% proximal lesion and 80% mid  lesion, and the left anterior descending was normal.  Diagonal 1 had a 40% ostial lesion and right coronary had a 40% mid lesion.  The aortic root was markedly ectatic.  LV was normal, 2+ to AI.  Dr. Elsie Lincoln proceeded with percutaneous intervention to her mid circumflex 80% lesion with a 3.0 NIR elite stent.  The patient tolerated the procedure well.  She was screened by our research coordinator but not a candidate due to cholesterol level of 332.  The patient was seen on June 08, 2000, and deemed stable for discharge to home. They recommended outpatient duplex of her carotids, as she  had carotid bruits and a 2-D echocardiogram to follow-up aortic insufficiency.  She was seen by cardiac rehab phase 1.  The patient was discharged home on June 08, 2000.  DISCHARGE MEDICATIONS: 1. Enteric coated aspirin 81 mg per day. 2. Plavix 7 mg a day for four weeks. 3. ______ 20 mg q.d. 4. Toprol 50 mg b.i.d. 5. Lopid 600 mg b.i.d. 6. Nitroglycerin as needed for chest pain. 7. Xanax 0.5 mg as needed. 8. Zyban 150 mg q.d. 9. Prevacid 30 mg two per day.  The patient should do no strenuous activity, lift more than 5 pounds, or drive for five days.  She should follow a low-fat, low-cholesterol, low-salt diet.  She is to observe the groin for any problems and call.  She is to call Dr. Reino Kent office for a 2-D echocardiogram and follow-up carotid Dopplers.  She is also asked to call the office for follow-up two week appointment with Dr. Elsie Lincoln. DD:  08/14/00 TD:  08/15/00 Job: [id number]

## 2013-07-10 ENCOUNTER — Emergency Department: Payer: Self-pay | Admitting: Emergency Medicine

## 2013-07-10 LAB — COMPREHENSIVE METABOLIC PANEL
Alkaline Phosphatase: 187 U/L — ABNORMAL HIGH (ref 50–136)
BUN: 11 mg/dL (ref 7–18)
Bilirubin,Total: 0.3 mg/dL (ref 0.2–1.0)
Calcium, Total: 9.4 mg/dL (ref 8.5–10.1)
Chloride: 105 mmol/L (ref 98–107)
Creatinine: 0.87 mg/dL (ref 0.60–1.30)
EGFR (Non-African Amer.): >60
Osmolality: 281 (ref 275–301)
Potassium: 3.3 mmol/L — ABNORMAL LOW (ref 3.5–5.1)
SGOT(AST): 20 U/L (ref 15–37)
Total Protein: 7.4 g/dL (ref 6.4–8.2)

## 2013-07-10 LAB — URINALYSIS, COMPLETE
Blood: NEGATIVE
Glucose,UR: NEGATIVE mg/dL (ref 0–75)
Ketone: NEGATIVE
Nitrite: POSITIVE
RBC,UR: 4 /HPF (ref 0–5)
Specific Gravity: 1.02 (ref 1.003–1.030)
WBC UR: 95 /HPF (ref 0–5)

## 2013-07-10 LAB — CBC
HCT: 36.5 % (ref 35.0–47.0)
MCH: 30 pg (ref 26.0–34.0)
MCHC: 33.8 g/dL (ref 32.0–36.0)
MCV: 89 fL (ref 80–100)
RBC: 4.11 10*6/uL (ref 3.80–5.20)
RDW: 15.7 % — ABNORMAL HIGH (ref 11.5–14.5)

## 2013-07-10 LAB — TROPONIN I: Troponin-I: <0.02 ng/mL

## 2013-09-07 ENCOUNTER — Inpatient Hospital Stay: Payer: Self-pay | Admitting: Internal Medicine

## 2013-09-07 LAB — PROTIME-INR
INR: 0.9
Prothrombin Time: 12.3 secs (ref 11.5–14.7)

## 2013-09-07 LAB — COMPREHENSIVE METABOLIC PANEL
Albumin: 3.9 g/dL (ref 3.4–5.0)
Alkaline Phosphatase: 231 U/L — ABNORMAL HIGH (ref 50–136)
Anion Gap: 7 (ref 7–16)
BUN: 5 mg/dL — ABNORMAL LOW (ref 7–18)
Bilirubin,Total: 0.2 mg/dL (ref 0.2–1.0)
Calcium, Total: 9.4 mg/dL (ref 8.5–10.1)
Chloride: 104 mmol/L (ref 98–107)
Creatinine: 0.67 mg/dL (ref 0.60–1.30)
EGFR (African American): >60
EGFR (Non-African Amer.): >60
Glucose: 95 mg/dL (ref 65–99)
Osmolality: 271 (ref 275–301)
Potassium: 3.9 mmol/L (ref 3.5–5.1)
SGPT (ALT): 18 U/L (ref 12–78)
Sodium: 137 mmol/L (ref 136–145)
Total Protein: 7.9 g/dL (ref 6.4–8.2)

## 2013-09-07 LAB — CBC
HCT: 37.7 % (ref 35.0–47.0)
MCH: 30.6 pg (ref 26.0–34.0)
MCHC: 34.4 g/dL (ref 32.0–36.0)
MCV: 89 fL (ref 80–100)
RBC: 4.24 10*6/uL (ref 3.80–5.20)
RDW: 13.9 % (ref 11.5–14.5)
WBC: 12.6 10*3/uL — ABNORMAL HIGH (ref 3.6–11.0)

## 2013-09-07 LAB — CK TOTAL AND CKMB (NOT AT ARMC)
CK, Total: 62 U/L (ref 21–215)
CK-MB: 1 ng/mL (ref 0.5–3.6)

## 2013-09-07 LAB — APTT: Activated PTT: 28.1 secs (ref 23.6–35.9)

## 2013-09-07 LAB — PHENYTOIN LEVEL, TOTAL: Dilantin: 7.8 ug/mL — ABNORMAL LOW (ref 10.0–20.0)

## 2013-09-08 LAB — CBC WITH DIFFERENTIAL/PLATELET
Basophil #: 0 10*3/uL (ref 0.0–0.1)
Basophil %: 0.1 %
Eosinophil %: 0 %
Lymphocyte #: 1.2 10*3/uL (ref 1.0–3.6)
MCH: 30.8 pg (ref 26.0–34.0)
MCV: 89 fL (ref 80–100)
Neutrophil %: 87.3 %
Platelet: 241 10*3/uL (ref 150–440)
RDW: 13.7 % (ref 11.5–14.5)
WBC: 13.2 10*3/uL — ABNORMAL HIGH (ref 3.6–11.0)

## 2013-09-08 LAB — BASIC METABOLIC PANEL
Anion Gap: 7 (ref 7–16)
BUN: 15 mg/dL (ref 7–18)
Co2: 26 mmol/L (ref 21–32)
Creatinine: 0.9 mg/dL (ref 0.60–1.30)
EGFR (African American): >60
EGFR (Non-African Amer.): >60
Glucose: 132 mg/dL — ABNORMAL HIGH (ref 65–99)
Sodium: 132 mmol/L — ABNORMAL LOW (ref 136–145)

## 2013-09-09 LAB — MAGNESIUM: Magnesium: 2.2 mg/dL

## 2013-09-10 LAB — BASIC METABOLIC PANEL
BUN: 29 mg/dL — ABNORMAL HIGH (ref 7–18)
Calcium, Total: 9 mg/dL (ref 8.5–10.1)
Chloride: 98 mmol/L (ref 98–107)
Co2: 31 mmol/L (ref 21–32)
EGFR (African American): >60
EGFR (Non-African Amer.): >60
Glucose: 180 mg/dL — ABNORMAL HIGH (ref 65–99)
Osmolality: 279 (ref 275–301)

## 2013-09-12 LAB — CULTURE, BLOOD (SINGLE)

## 2013-09-20 ENCOUNTER — Emergency Department: Payer: Self-pay | Admitting: Emergency Medicine

## 2013-10-02 ENCOUNTER — Ambulatory Visit: Payer: Self-pay | Admitting: Orthopedic Surgery

## 2013-10-13 ENCOUNTER — Emergency Department: Payer: Self-pay | Admitting: Emergency Medicine

## 2013-10-13 LAB — URINALYSIS, COMPLETE
Bilirubin,UR: NEGATIVE
Blood: NEGATIVE
Glucose,UR: NEGATIVE mg/dL (ref 0–75)
Nitrite: NEGATIVE
Ph: 6 (ref 4.5–8.0)
RBC,UR: 2 /HPF (ref 0–5)
Specific Gravity: 1.011 (ref 1.003–1.030)
Squamous Epithelial: 17
WBC UR: 3 /HPF (ref 0–5)

## 2013-10-13 LAB — COMPREHENSIVE METABOLIC PANEL
Alkaline Phosphatase: 237 U/L — ABNORMAL HIGH (ref 50–136)
Anion Gap: 6 — ABNORMAL LOW (ref 7–16)
BUN: 7 mg/dL (ref 7–18)
Bilirubin,Total: 0.2 mg/dL (ref 0.2–1.0)
Calcium, Total: 9.2 mg/dL (ref 8.5–10.1)
Chloride: 105 mmol/L (ref 98–107)
Creatinine: 0.64 mg/dL (ref 0.60–1.30)
EGFR (African American): >60
Glucose: 100 mg/dL — ABNORMAL HIGH (ref 65–99)
Osmolality: 276 (ref 275–301)
Sodium: 139 mmol/L (ref 136–145)
Total Protein: 7.2 g/dL (ref 6.4–8.2)

## 2013-10-13 LAB — CBC
HCT: 38.1 % (ref 35.0–47.0)
MCHC: 35.2 g/dL (ref 32.0–36.0)
MCV: 89 fL (ref 80–100)
RDW: 13.5 % (ref 11.5–14.5)
WBC: 9.3 10*3/uL (ref 3.6–11.0)

## 2013-10-13 LAB — DRUG SCREEN, URINE
Barbiturates, Ur Screen: NEGATIVE (ref ?–200)
MDMA (Ecstasy)Ur Screen: NEGATIVE (ref ?–500)
Methadone, Ur Screen: NEGATIVE (ref ?–300)
Phencyclidine (PCP) Ur S: NEGATIVE (ref ?–25)
Tricyclic, Ur Screen: NEGATIVE (ref ?–1000)

## 2013-10-13 LAB — TSH: Thyroid Stimulating Horm: 0.978 u[IU]/mL

## 2013-10-13 LAB — ETHANOL: Ethanol %: <0.003 % (ref 0.000–0.080)

## 2013-10-26 ENCOUNTER — Inpatient Hospital Stay: Payer: Self-pay | Admitting: Internal Medicine

## 2013-10-26 LAB — CBC WITH DIFFERENTIAL/PLATELET
Basophil #: 0.2 10*3/uL — ABNORMAL HIGH (ref 0.0–0.1)
Basophil %: 1.3 %
Eosinophil #: 0.3 10*3/uL (ref 0.0–0.7)
HCT: 37.5 % (ref 35.0–47.0)
HGB: 12.9 g/dL (ref 12.0–16.0)
MCHC: 34.4 g/dL (ref 32.0–36.0)
MCV: 91 fL (ref 80–100)
Monocyte %: 7.4 %
Neutrophil #: 10.2 10*3/uL — ABNORMAL HIGH (ref 1.4–6.5)
Neutrophil %: 70.9 %
Platelet: 218 10*3/uL (ref 150–440)
RBC: 4.12 10*6/uL (ref 3.80–5.20)
RDW: 13.4 % (ref 11.5–14.5)
WBC: 14.4 10*3/uL — ABNORMAL HIGH (ref 3.6–11.0)

## 2013-10-26 LAB — BASIC METABOLIC PANEL
Anion Gap: 6 — ABNORMAL LOW (ref 7–16)
BUN: 8 mg/dL (ref 7–18)
Calcium, Total: 9.1 mg/dL (ref 8.5–10.1)
Co2: 29 mmol/L (ref 21–32)
Creatinine: 0.75 mg/dL (ref 0.60–1.30)
EGFR (African American): >60
EGFR (Non-African Amer.): >60
Osmolality: 278 (ref 275–301)
Potassium: 3.8 mmol/L (ref 3.5–5.1)

## 2013-10-26 LAB — TROPONIN I: Troponin-I: 0.11 ng/mL — ABNORMAL HIGH

## 2013-10-26 LAB — PRO B NATRIURETIC PEPTIDE: B-Type Natriuretic Peptide: 325 pg/mL — ABNORMAL HIGH (ref 0–125)

## 2013-10-27 LAB — CBC WITH DIFFERENTIAL/PLATELET
Basophil %: 0.2 %
HCT: 32.3 % — ABNORMAL LOW (ref 35.0–47.0)
Lymphocyte #: 0.5 10*3/uL — ABNORMAL LOW (ref 1.0–3.6)
Lymphocyte %: 5.8 %
MCH: 31.8 pg (ref 26.0–34.0)
Monocyte #: 0.2 x10 3/mm (ref 0.2–0.9)
Neutrophil %: 91.8 %
Platelet: 190 10*3/uL (ref 150–440)

## 2013-10-27 LAB — TROPONIN I
Troponin-I: 0.05 ng/mL
Troponin-I: 0.03 ng/mL

## 2013-10-27 LAB — TSH: Thyroid Stimulating Horm: 0.5 u[IU]/mL

## 2013-10-27 LAB — MAGNESIUM: Magnesium: 1.6 mg/dL — ABNORMAL LOW

## 2013-10-28 ENCOUNTER — Ambulatory Visit: Payer: Self-pay | Admitting: Pain Medicine

## 2013-11-13 ENCOUNTER — Ambulatory Visit: Payer: Self-pay | Admitting: Pain Medicine

## 2013-11-18 ENCOUNTER — Emergency Department: Payer: Self-pay | Admitting: Internal Medicine

## 2013-11-18 LAB — DRUG SCREEN, URINE
Amphetamines, Ur Screen: NEGATIVE (ref ?–1000)
Cocaine Metabolite,Ur ~~LOC~~: NEGATIVE (ref ?–300)
MDMA (Ecstasy)Ur Screen: NEGATIVE (ref ?–500)
Opiate, Ur Screen: NEGATIVE (ref ?–300)
Tricyclic, Ur Screen: POSITIVE (ref ?–1000)

## 2013-11-18 LAB — URINALYSIS, COMPLETE
Glucose,UR: NEGATIVE mg/dL (ref 0–75)
Leukocyte Esterase: NEGATIVE
Protein: NEGATIVE
RBC,UR: 1 /HPF (ref 0–5)
Specific Gravity: 1.019 (ref 1.003–1.030)
Squamous Epithelial: 6

## 2013-11-18 LAB — CBC
HCT: 39.1 % (ref 35.0–47.0)
HGB: 13.8 g/dL (ref 12.0–16.0)
MCHC: 35.4 g/dL (ref 32.0–36.0)
Platelet: 179 10*3/uL (ref 150–440)
RBC: 4.37 10*6/uL (ref 3.80–5.20)
WBC: 8.8 10*3/uL (ref 3.6–11.0)

## 2013-11-18 LAB — ACETAMINOPHEN LEVEL: Acetaminophen: <2 ug/mL

## 2013-11-18 LAB — COMPREHENSIVE METABOLIC PANEL
BUN: 14 mg/dL (ref 7–18)
Bilirubin,Total: 0.3 mg/dL (ref 0.2–1.0)
Calcium, Total: 8.9 mg/dL (ref 8.5–10.1)
Creatinine: 0.75 mg/dL (ref 0.60–1.30)
EGFR (African American): >60
Glucose: 96 mg/dL (ref 65–99)
Osmolality: 278 (ref 275–301)
Sodium: 139 mmol/L (ref 136–145)

## 2013-11-18 LAB — ETHANOL
Ethanol %: <0.003 % (ref 0.000–0.080)
Ethanol: <3 mg/dL

## 2013-11-19 ENCOUNTER — Encounter (HOSPITAL_COMMUNITY): Payer: Self-pay | Admitting: Emergency Medicine

## 2013-11-19 ENCOUNTER — Emergency Department (HOSPITAL_COMMUNITY)
Admission: EM | Admit: 2013-11-19 | Discharge: 2013-11-25 | Disposition: A | Discharge status: Home / Self Care | Payer: Medicare Other | Attending: Emergency Medicine | Admitting: Emergency Medicine

## 2013-11-19 DIAGNOSIS — Z7902 Long term (current) use of antithrombotics/antiplatelets: Secondary | ICD-10-CM | POA: Insufficient documentation

## 2013-11-19 DIAGNOSIS — F17200 Nicotine dependence, unspecified, uncomplicated: Secondary | ICD-10-CM | POA: Insufficient documentation

## 2013-11-19 DIAGNOSIS — J449 Chronic obstructive pulmonary disease, unspecified: Secondary | ICD-10-CM | POA: Insufficient documentation

## 2013-11-19 DIAGNOSIS — IMO0002 Reserved for concepts with insufficient information to code with codable children: Secondary | ICD-10-CM | POA: Insufficient documentation

## 2013-11-19 DIAGNOSIS — F419 Anxiety disorder, unspecified: Secondary | ICD-10-CM | POA: Diagnosis present

## 2013-11-19 DIAGNOSIS — F329 Major depressive disorder, single episode, unspecified: Secondary | ICD-10-CM | POA: Insufficient documentation

## 2013-11-19 DIAGNOSIS — I509 Heart failure, unspecified: Secondary | ICD-10-CM | POA: Insufficient documentation

## 2013-11-19 DIAGNOSIS — F1310 Sedative, hypnotic or anxiolytic abuse, uncomplicated: Secondary | ICD-10-CM | POA: Insufficient documentation

## 2013-11-19 DIAGNOSIS — Z79899 Other long term (current) drug therapy: Secondary | ICD-10-CM | POA: Insufficient documentation

## 2013-11-19 DIAGNOSIS — Z880 Allergy status to penicillin: Secondary | ICD-10-CM | POA: Insufficient documentation

## 2013-11-19 DIAGNOSIS — J4489 Other specified chronic obstructive pulmonary disease: Secondary | ICD-10-CM | POA: Insufficient documentation

## 2013-11-19 DIAGNOSIS — G40909 Epilepsy, unspecified, not intractable, without status epilepticus: Secondary | ICD-10-CM | POA: Insufficient documentation

## 2013-11-19 DIAGNOSIS — F332 Major depressive disorder, recurrent severe without psychotic features: Secondary | ICD-10-CM | POA: Diagnosis present

## 2013-11-19 DIAGNOSIS — F411 Generalized anxiety disorder: Secondary | ICD-10-CM | POA: Insufficient documentation

## 2013-11-19 DIAGNOSIS — F3289 Other specified depressive episodes: Secondary | ICD-10-CM | POA: Insufficient documentation

## 2013-11-19 DIAGNOSIS — I10 Essential (primary) hypertension: Secondary | ICD-10-CM | POA: Insufficient documentation

## 2013-11-19 DIAGNOSIS — Z8673 Personal history of transient ischemic attack (TIA), and cerebral infarction without residual deficits: Secondary | ICD-10-CM | POA: Insufficient documentation

## 2013-11-19 HISTORY — DX: Cerebral infarction, unspecified: I63.9

## 2013-11-19 HISTORY — DX: Unspecified convulsions: R56.9

## 2013-11-19 HISTORY — DX: Depression, unspecified: F32.A

## 2013-11-19 HISTORY — DX: Essential (primary) hypertension: I10

## 2013-11-19 HISTORY — DX: Heart failure, unspecified: I50.9

## 2013-11-19 HISTORY — DX: Anxiety disorder, unspecified: F41.9

## 2013-11-19 HISTORY — DX: Major depressive disorder, single episode, unspecified: F32.9

## 2013-11-19 HISTORY — DX: Chronic obstructive pulmonary disease, unspecified: J44.9

## 2013-11-19 LAB — CBC WITH DIFFERENTIAL/PLATELET
Basophils Absolute: 0.1 10*3/uL (ref 0.0–0.1)
Basophils Relative: 0 % (ref 0–1)
Hemoglobin: 13.6 g/dL (ref 12.0–15.0)
MCHC: 35.1 g/dL (ref 30.0–36.0)
Monocytes Relative: 6 % (ref 3–12)
Neutro Abs: 8.1 10*3/uL — ABNORMAL HIGH (ref 1.7–7.7)
Neutrophils Relative %: 67 % (ref 43–77)
Platelets: 221 10*3/uL (ref 150–400)

## 2013-11-19 LAB — COMPREHENSIVE METABOLIC PANEL
ALT: 13 U/L (ref 0–35)
AST: 12 U/L (ref 0–37)
Albumin: 3.8 g/dL (ref 3.5–5.2)
Alkaline Phosphatase: 158 U/L — ABNORMAL HIGH (ref 39–117)
BUN: 11 mg/dL (ref 6–23)
Chloride: 103 mEq/L (ref 96–112)
Potassium: 3.4 mEq/L — ABNORMAL LOW (ref 3.5–5.1)
Sodium: 136 mEq/L (ref 135–145)
Total Bilirubin: 0.3 mg/dL (ref 0.3–1.2)
Total Protein: 7 g/dL (ref 6.0–8.3)

## 2013-11-19 LAB — URINALYSIS, ROUTINE W REFLEX MICROSCOPIC
Ketones, ur: NEGATIVE mg/dL
Leukocytes, UA: NEGATIVE
Protein, ur: NEGATIVE mg/dL
Urobilinogen, UA: 0.2 mg/dL (ref 0.0–1.0)

## 2013-11-19 LAB — RAPID URINE DRUG SCREEN, HOSP PERFORMED
Amphetamines: NOT DETECTED
Barbiturates: NOT DETECTED
Tetrahydrocannabinol: NOT DETECTED

## 2013-11-19 MED ORDER — ACETAMINOPHEN 325 MG PO TABS
650.0000 mg | ORAL_TABLET | ORAL | Status: DC | PRN
  Administered 2013-11-19 – 2013-11-24 (×7): 650 mg via ORAL
  Filled 2013-11-19 (×7): qty 2

## 2013-11-19 MED ORDER — AZITHROMYCIN 250 MG PO TABS
250.0000 mg | ORAL_TABLET | Freq: Every day | ORAL | Status: AC
  Administered 2013-11-20 – 2013-11-23 (×4): 250 mg via ORAL
  Filled 2013-11-19 (×4): qty 1

## 2013-11-19 MED ORDER — ALUM & MAG HYDROXIDE-SIMETH 200-200-20 MG/5ML PO SUSP: 30.0000 mL | ORAL | Status: DC | PRN

## 2013-11-19 MED ORDER — LORAZEPAM 1 MG PO TABS
1.0000 mg | ORAL_TABLET | Freq: Once | ORAL | Status: AC
  Administered 2013-11-19: 1 mg via ORAL
  Filled 2013-11-19: qty 1

## 2013-11-19 MED ORDER — IBUPROFEN 200 MG PO TABS
600.0000 mg | ORAL_TABLET | Freq: Three times a day (TID) | ORAL | Status: DC | PRN
  Administered 2013-11-20 – 2013-11-25 (×7): 600 mg via ORAL
  Filled 2013-11-19 (×7): qty 3

## 2013-11-19 MED ORDER — ZOLPIDEM TARTRATE 5 MG PO TABS
5.0000 mg | ORAL_TABLET | Freq: Every evening | ORAL | Status: DC | PRN
  Administered 2013-11-19 – 2013-11-24 (×5): 5 mg via ORAL
  Filled 2013-11-19 (×5): qty 1

## 2013-11-19 MED ORDER — NICOTINE 21 MG/24HR TD PT24: 21.0000 mg | MEDICATED_PATCH | Freq: Every day | TRANSDERMAL | Status: DC

## 2013-11-19 MED ORDER — NICOTINE POLACRILEX 2 MG MT GUM: 2.0000 mg | CHEWING_GUM | OROMUCOSAL | Status: DC | PRN

## 2013-11-19 MED ORDER — LORAZEPAM 1 MG PO TABS
1.0000 mg | ORAL_TABLET | Freq: Three times a day (TID) | ORAL | Status: DC | PRN
  Administered 2013-11-19 – 2013-11-25 (×12): 1 mg via ORAL
  Filled 2013-11-19 (×14): qty 1

## 2013-11-19 MED ORDER — ONDANSETRON HCL 4 MG PO TABS: 4.0000 mg | ORAL_TABLET | Freq: Three times a day (TID) | ORAL | Status: DC | PRN

## 2013-11-19 NOTE — ED Provider Notes (Deleted)
Medical screening examination/treatment/procedure(s) were performed by non-physician practitioner and as supervising physician I was immediately available for consultation/collaboration.  Lanier Millon L Bernis Schreur, MD 11/19/13 2337 

## 2013-11-19 NOTE — BH Assessment (Signed)
Assessment Note  Lori Cooke is a 62 y.o. female who presents to [facility] with SI/Depression.  Pt denies any plan or intent harm self.  This Clinical research associate discussed clinicals with Dr. Effie Shy.  Pt reports the the following: she has been SI x2wks and has attempted to harm self 5x's in the past by overdosing on pills. Pt admits she tried to cut self last week with a razor and someone took it away from her.  Pt has appt with a psychiatrist on 12/01/13(doesn't know psych's name).  Pt.'s precip events include: health problems(uses oxygen) daughter moved to [place] and states she was relocating but "it fell through". Pt states she went to [facility] on 11/18/13 for the same problem--SI/Depression and declined for admission because she uses oxygen.  Pt endorses depressive sxs: crying spells, isolating self, insomnia, poor appetite.  Pt also has daily panic attacks(last one 11/18/13) and says she cannot be alone. Pt cannot contract for safety.     Axis I: Anxiety Disorder NOS and Major Depression, Recurrent severe Axis II: Deferred Axis III:  Past Medical History  Diagnosis Date  . COPD (chronic obstructive pulmonary disease)   . CHF (congestive heart failure)   . Hypertension   . Seizures   . Stroke   . Depression   . Anxiety    Axis IV: housing problems, other psychosocial or environmental problems, problems related to social environment and problems with primary support group Axis V: 31-40 impairment in reality testing  Past Medical History:  Past Medical History  Diagnosis Date  . COPD (chronic obstructive pulmonary disease)   . CHF (congestive heart failure)   . Hypertension   . Seizures   . Stroke   . Depression   . Anxiety     Past Surgical History  Procedure Laterality Date  . Abdominal hysterectomy    . Cholecystectomy      Family History: History reviewed. No pertinent family history.  Social History:  reports that she has been smoking Cigarettes.  She has been smoking  about 2.00 packs per day. She does not have any smokeless tobacco history on file. She reports that she does not drink alcohol or use illicit drugs.  Additional Social History:  Alcohol / Drug Use Pain Medications: See MAR  Prescriptions: See MAR  Over the Counter: See MAR  History of alcohol / drug use?: No history of alcohol / drug abuse Longest period of sobriety (when/how long): Pt denies   CIWA: CIWA-Ar BP: 96/58 mmHg Pulse Rate: 82 COWS:    Allergies:  Allergies  Allergen Reactions  . Adhesive [Tape]     Blisters  . Levaquin [Levofloxacin] Nausea And Vomiting  . Penicillins Rash  . Sulfa Antibiotics Rash    Home Medications:  (Not in a hospital admission)  OB/GYN Status:  No LMP recorded. Patient has had a hysterectomy.  General Assessment Data Location of Assessment: [facility] ED Is this a Tele or Face-to-Face Assessment?: Tele Assessment Is this an Initial Assessment or a Re-assessment for this encounter?: Initial Assessment Living Arrangements: Alone Can pt return to current living arrangement?: Yes Admission Status: Voluntary Is patient capable of signing voluntary admission?: Yes Transfer from: Acute Hospital Referral Source: MD  Medical Screening Exam [facility] Walk-in ONLY) Medical Exam completed: No Reason for MSE not completed: Other: (None )  [facility] Crisis Care Plan Living Arrangements: Alone Name of Psychiatrist: Has an appt scheduled for 12/01/13; unk psych Name of Therapist: None   Education Status Is patient currently in  school?: No Current Grade: None  Highest grade of school patient has completed: None Name of school: None  Contact person: None  Risk to self Suicidal Ideation: Yes-Currently Present Suicidal Intent: No-Not Currently/Within Last 6 Months Is patient at risk for suicide?: Yes Suicidal Plan?: No-Not Currently/Within Last 6 Months Access to Means: Yes Specify Access to Suicidal Means: Pills, sharps, razors, knives  What has been your  use of drugs/alcohol within the last 12 months?: Pt denies  Previous Attempts/Gestures: Yes How many times?: 5 Other Self Harm Risks: None Triggers for Past Attempts: Unpredictable Intentional Self Injurious Behavior: None Family Suicide History: No Recent stressful life event(s): Loss (Comment);Other (Comment) (Health; daughter moved away; relocation fell through ) Persecutory voices/beliefs?: No Depression: Yes Depression Symptoms: Insomnia;Tearfulness;Isolating;Loss of interest in usual pleasures;Feeling worthless/self pity Substance abuse history and/or treatment for substance abuse?: No Suicide prevention information given to non-admitted patients: Not applicable  Risk to Others Homicidal Ideation: No Thoughts of Harm to Others: No Current Homicidal Intent: No Current Homicidal Plan: No Access to Homicidal Means: No Identified Victim: None History of harm to others?: No Assessment of Violence: None Noted Violent Behavior Description: None Does patient have access to weapons?: No Criminal Charges Pending?: No Does patient have a court date: No  Psychosis Hallucinations: None noted Delusions: None noted  Mental Status Report Appear/Hygiene: Disheveled Eye Contact: Good Motor Activity: Unremarkable Speech: Logical/coherent Level of Consciousness: Alert Mood: Depressed;Sad Affect: Depressed;Sad Anxiety Level: Panic Attacks Panic attack frequency: Daily Most recent panic attack: 11/18/13 Thought Processes: Coherent;Relevant Judgement: Impaired Orientation: Person;Place;Time;Situation Obsessive Compulsive Thoughts/Behaviors: None  Cognitive Functioning Concentration: Normal Memory: Recent Intact;Remote Intact IQ: Average Insight: Poor Impulse Control: Poor Appetite: Poor Weight Loss: 0 Weight Gain: 0 Sleep: Decreased Total Hours of Sleep: 2 Vegetative Symptoms: None  ADLScreening [facility] Assessment Services) Patient's cognitive ability adequate to safely  complete daily activities?: Yes Patient able to express need for assistance with ADLs?: Yes Independently performs ADLs?: Yes (appropriate for developmental age)  Prior Inpatient Therapy Prior Inpatient Therapy: Yes Prior Therapy Dates: Unk  Prior Therapy Facilty/Provider(s): [facility], [facility]  Reason for Treatment: Depression/SI  Prior Outpatient Therapy Prior Outpatient Therapy: Yes Prior Therapy Dates: 12/01/13 Prior Therapy Facilty/Provider(s): Unk provider  Reason for Treatment: Med Mgt   ADL Screening (condition at time of admission) Patient's cognitive ability adequate to safely complete daily activities?: Yes Is the patient deaf or have difficulty hearing?: No Does the patient have difficulty seeing, even when wearing glasses/contacts?: No Does the patient have difficulty concentrating, remembering, or making decisions?: No Patient able to express need for assistance with ADLs?: Yes Does the patient have difficulty dressing or bathing?: No Independently performs ADLs?: Yes (appropriate for developmental age) Does the patient have difficulty walking or climbing stairs?: No Weakness of Legs: None Weakness of Arms/Hands: None  Home Assistive Devices/Equipment Home Assistive Devices/Equipment: Oxygen  Therapy Consults (therapy consults require a physician order) PT Evaluation Needed: No OT Evalulation Needed: No SLP Evaluation Needed: No Abuse/Neglect Assessment (Assessment to be complete while patient is alone) Physical Abuse: Denies Verbal Abuse: Denies Sexual Abuse: Denies Exploitation of patient/patient's resources: Denies Self-Neglect: Denies Values / Beliefs Cultural Requests During Hospitalization: None Spiritual Requests During Hospitalization: None Consults Spiritual Care Consult Needed: No Social Work Consult Needed: No Merchant navy officer (For Healthcare) Advance Directive: Patient does not have advance directive;Patient would  not like information Pre-existing out of facility DNR order (yellow form or pink MOST form): No Nutrition Screen- [facility] Adult/[facility] Patient's home  diet: Regular  Additional Information 1:1 In Past 12 Months?: No CIRT Risk: No Elopement Risk: No Does patient have medical clearance?: Yes     Disposition:  Disposition Initial Assessment Completed for this Encounter: Yes Disposition of Patient: Inpatient treatment program;Referred to [facility]) Type of inpatient treatment program: Adult Patient referred to: Other (Comment) [facility])  On Site Evaluation by:   Reviewed with Physician:    Murrell Redden 11/19/2013 10:59 PM

## 2013-11-19 NOTE — Progress Notes (Deleted)
   CARE MANAGEMENT ED NOTE 11/19/2013  Patient:  Lori Cooke   Account Number:  [id number]  Date Initiated:  11/19/2013  Documentation initiated by:  Edd Arbour  Subjective/Objective Assessment:   62 yr old [place] access pt states he does not have a pcp and there is not one in EPIC There is not a scanned medicaid card in [employer]     Subjective/Objective Assessment Detail:     Action/Plan:   CM spoke with pt and given list of [place] [employer]   Action/Plan Detail:   Anticipated DC Date:       Status Recommendation to Physician:   Result of Recommendation:    Other ED Services  Consult Working Plan    DC Planning Services  Other  PCP issues  Outpatient Services - Pt will follow up    Choice offered to / List presented to:            Status of service:  Completed, signed off  ED Comments:   ED Comments Detail:

## 2013-11-19 NOTE — ED Provider Notes (Signed)
  Face-to-face evaluation   History: She is here for evaluation of depression and anxiety and thoughts of suicide. She complains of not being able to keep her thoughts straight. She has had. She lives alone. Her family members live in another town. She states that yesterday she was at an outlying emergency department and evaluated for the same symptoms. She was ultimately discharged and told to go back to see her primary care doctor. Today, her primary care doctor, suggested that she come here. She has had thoughts of cutting herself with razors, the last time one week ago. Currently, today, she does not have a plan to kill herself. She has had prior suicide attempts, x5, by taking pills.   Physical exam: Elderly, appearing, alert, anxious, cooperative, tearful. Right hip and knee are tender to palpation and had increased pain on movement. She has mild lumbar tenderness. Psychiatric- no hallucinations. No pressured speech. No psychosis.  tearful, crying, anxious.  The patient agrees to proceed with evaluation by TTS in psychiatry. I think she is at low risk for elopement (1545)   Medical screening examination/treatment/procedure(s) were conducted as a shared visit with non-physician practitioner(s) and myself.  I personally evaluated the patient during the encounter  Flint Melter, MD 11/19/13 [id number]

## 2013-11-19 NOTE — ED Provider Notes (Signed)
CSN: [id number]     Arrival date & time 11/19/13  1401 History  This chart was scribed for non-physician practitioner working with Gray Bernhardt, DO by Ashley Jacobs, ED scribe. This patient was seen in room WTR5/WTR5 and the patient's care was started at 2:27 PM.   First MD Initiated Contact with Patient 11/19/13 1402     Chief Complaint  Patient presents with  . Medical Clearance   (Consider location/radiation/quality/duration/timing/severity/associated sxs/prior Treatment) The history is provided by the patient and medical records. No language interpreter was used.   HPI Comments: MEKAYLAH KLICH is a 62 y.o. female who presents to the Emergency Department for Medical Clearance after experiencing anxiety and suicidal idealizations for the past two weeks. Pt states she "can't communicate" or get anything together. She reports her "anxiety" is through the roof. Her home health nurse states she had an suicide attempt last Thursday night. She was sent home from [facility] ED last night after refusing to admit her to [facility] because she wears oxygen at night. Pt  has suicidal plans to injury herself with a razor. Per nurse, she feels sad about her daughter leaving her and pt states "nobody wants her". Pt denies HI and hallucinations. She has a medical hx of COPD, CHF, HTN, seizures and stroke. Pt does not use alcohol or recreational drugs. She currently smokes 2 packs of cigarettes a day.  Pt does not have any known allergies to medications.   Dr. Oralia Manis Past Medical History  Diagnosis Date  . COPD (chronic obstructive pulmonary disease)   . CHF (congestive heart failure)   . Hypertension   . Seizures   . Stroke    Past Surgical History  Procedure Laterality Date  . Abdominal hysterectomy    . Cholecystectomy     History reviewed. No pertinent family history. History  Substance Use Topics  . Smoking status: Current Every Day Smoker -- 2.00 packs/day    Types:  Cigarettes  . Smokeless tobacco: Not on file  . Alcohol Use: No   OB History   Grav Para Term Preterm Abortions TAB SAB Ect Mult Living                 Review of Systems  Respiratory: Positive for shortness of breath.   Psychiatric/Behavioral: Positive for suicidal ideas. Negative for hallucinations. The patient is nervous/anxious.   All other systems reviewed and are negative.    Allergies  Review of patient's allergies indicates not on file.  Home Medications  No current outpatient prescriptions on file. BP 138/78  Pulse 101  Temp(Src) 98.9 F (37.2 C) (Oral)  Resp 20  Ht 5\' 3"  (1.6 m)  Wt 149 lb (67.586 kg)  BMI 26.40 kg/m2  SpO2 94% Physical Exam  Nursing note and vitals reviewed. Constitutional: She is oriented to person, place, and time. She appears well-developed and well-nourished. No distress.  HENT:  Head: Normocephalic and atraumatic.  Right Ear: External ear normal.  Left Ear: External ear normal.  Nose: Nose normal.  Mouth/Throat: Oropharynx is clear and moist.  Eyes: Conjunctivae are normal.  Neck: Normal range of motion.  Cardiovascular: Normal rate, regular rhythm and normal heart sounds.   Pulmonary/Chest: Effort normal. No stridor. No respiratory distress. She has wheezes. She has no rales.  Abdominal: Soft. She exhibits no distension.  Musculoskeletal: Normal range of motion.  Neurological: She is alert and oriented to person, place, and time. She has normal strength.  Skin: Skin is warm and dry.  She is not diaphoretic. No erythema.  Psychiatric: Her behavior is normal. She exhibits a depressed mood. She expresses suicidal ideation.  tearful    ED Course  Procedures (including critical care time) DIAGNOSTIC STUDIES: Oxygen Saturation is 94% on room air, normal by my interpretation.    COORDINATION OF CARE: 2:31 PM Discussed course of care with pt which includes Atrovent  . Pt understands and agrees.  Labs Review Labs Reviewed  CBC WITH  DIFFERENTIAL - Abnormal; Notable for the following:    WBC 12.1 (*)    Neutro Abs 8.1 (*)    All other components within normal limits  COMPREHENSIVE METABOLIC PANEL - Abnormal; Notable for the following:    Potassium 3.4 (*)    Glucose, Bld 118 (*)    Alkaline Phosphatase 158 (*)    All other components within normal limits  URINE RAPID DRUG SCREEN (HOSP PERFORMED) - Abnormal; Notable for the following:    Benzodiazepines POSITIVE (*)    All other components within normal limits  ETHANOL  URINALYSIS, ROUTINE W REFLEX MICROSCOPIC   Imaging Review No results found.  EKG Interpretation   None       MDM   1. Anxiety   2. Depression    Patient presents to the emergency department with suicidal ideation and prior suicide attempts x5. She is medically cleared. She was prescribed Zithromax yesterday. It she was given her dose today in the emergency department. She needs to be placed in a facility where she is allowed to wear her oxygen at night. Vital signs stable at this time. Dr. Effie Shy evaluated patient and agrees with plan. Patient / Family / Caregiver informed of clinical course, understand medical decision-making process, and agree with plan.  Records were obtained from [place] from her visit to the emergency department yesterday. Chest x-ray shows no acute cardiopulmonary disease.  I personally performed the services described in this documentation, which was scribed in my presence. The recorded information has been reviewed and is accurate.     Mora Bellman, PA-C 11/19/13 [phone]

## 2013-11-19 NOTE — ED Notes (Signed)
Pt c/o anxiety and suicidal thoughts for the past two weeks.  Pt reports plan of using razor blade to hurt herself.  Denies homicidal thoughts or hallucinations.

## 2013-11-20 MED ORDER — CLOPIDOGREL BISULFATE 75 MG PO TABS
75.0000 mg | ORAL_TABLET | Freq: Every day | ORAL | Status: DC
  Administered 2013-11-20 – 2013-11-25 (×6): 75 mg via ORAL
  Filled 2013-11-20 (×8): qty 1

## 2013-11-20 MED ORDER — LORAZEPAM 1 MG PO TABS
ORAL_TABLET | ORAL | Status: AC
  Filled 2013-11-20: qty 1

## 2013-11-20 MED ORDER — ATORVASTATIN CALCIUM 20 MG PO TABS
20.0000 mg | ORAL_TABLET | Freq: Every day | ORAL | Status: DC
  Administered 2013-11-20 – 2013-11-24 (×5): 20 mg via ORAL
  Filled 2013-11-20 (×6): qty 1

## 2013-11-20 MED ORDER — GABAPENTIN 300 MG PO CAPS
600.0000 mg | ORAL_CAPSULE | Freq: Three times a day (TID) | ORAL | Status: DC
  Administered 2013-11-20 – 2013-11-21 (×4): 600 mg via ORAL
  Administered 2013-11-21: 300 mg via ORAL
  Administered 2013-11-22 – 2013-11-25 (×11): 600 mg via ORAL
  Filled 2013-11-20 (×19): qty 2

## 2013-11-20 MED ORDER — DILTIAZEM HCL ER COATED BEADS 120 MG PO CP24
120.0000 mg | ORAL_CAPSULE | Freq: Every day | ORAL | Status: DC
  Administered 2013-11-20 – 2013-11-25 (×5): 120 mg via ORAL
  Filled 2013-11-20 (×7): qty 1

## 2013-11-20 MED ORDER — PHENYTOIN SODIUM EXTENDED 100 MG PO CAPS
200.0000 mg | ORAL_CAPSULE | Freq: Three times a day (TID) | ORAL | Status: DC
  Administered 2013-11-20 – 2013-11-25 (×16): 200 mg via ORAL
  Filled 2013-11-20 (×17): qty 2

## 2013-11-20 MED ORDER — IBUPROFEN 200 MG PO TABS
400.0000 mg | ORAL_TABLET | Freq: Four times a day (QID) | ORAL | Status: DC | PRN
  Administered 2013-11-21 (×2): 400 mg via ORAL
  Filled 2013-11-20 (×2): qty 2

## 2013-11-20 MED ORDER — MOMETASONE FURO-FORMOTEROL FUM 100-5 MCG/ACT IN AERO
2.0000 | INHALATION_SPRAY | Freq: Two times a day (BID) | RESPIRATORY_TRACT | Status: DC
  Administered 2013-11-20 – 2013-11-25 (×11): 2 via RESPIRATORY_TRACT
  Filled 2013-11-20 (×2): qty 8.8

## 2013-11-20 MED ORDER — PANTOPRAZOLE SODIUM 40 MG PO TBEC
40.0000 mg | DELAYED_RELEASE_TABLET | Freq: Every day | ORAL | Status: DC
  Administered 2013-11-20 – 2013-11-25 (×6): 40 mg via ORAL
  Filled 2013-11-20 (×7): qty 1

## 2013-11-20 MED ORDER — LORAZEPAM 1 MG PO TABS
1.0000 mg | ORAL_TABLET | Freq: Once | ORAL | Status: AC
  Administered 2013-11-20: 1 mg via ORAL

## 2013-11-20 MED ORDER — TIOTROPIUM BROMIDE MONOHYDRATE 18 MCG IN CAPS
18.0000 ug | ORAL_CAPSULE | Freq: Every day | RESPIRATORY_TRACT | Status: DC
  Administered 2013-11-20 – 2013-11-25 (×6): 18 ug via RESPIRATORY_TRACT
  Filled 2013-11-20 (×2): qty 5

## 2013-11-20 MED ORDER — METOPROLOL SUCCINATE ER 25 MG PO TB24
25.0000 mg | ORAL_TABLET | Freq: Every day | ORAL | Status: DC
  Administered 2013-11-20 – 2013-11-25 (×5): 25 mg via ORAL
  Filled 2013-11-20 (×6): qty 1

## 2013-11-20 MED ORDER — QUETIAPINE FUMARATE 100 MG PO TABS
100.0000 mg | ORAL_TABLET | Freq: Every day | ORAL | Status: DC
  Administered 2013-11-20 – 2013-11-24 (×5): 100 mg via ORAL
  Filled 2013-11-20 (×5): qty 1

## 2013-11-20 MED ORDER — AMLODIPINE BESYLATE 10 MG PO TABS
10.0000 mg | ORAL_TABLET | Freq: Every day | ORAL | Status: DC
  Administered 2013-11-20 – 2013-11-25 (×5): 10 mg via ORAL
  Filled 2013-11-20 (×6): qty 1

## 2013-11-20 NOTE — ED Notes (Signed)
Almyra Deforest, RN called from the facility to check on the patient and wanted her to know that she called.

## 2013-11-20 NOTE — Consult Note (Signed)
[place] Face-to-Face Psychiatry Consult   Reason for Consult:  depression Referring Physician:  EDP  Lori [place] is an 62 y.o. female.  Assessment: AXIS I:  Anxiety Disorder NOS and Major Depression, Recurrent severe AXIS II:  Deferred AXIS III:   Past Medical History  Diagnosis Date  . COPD (chronic obstructive pulmonary disease)   . CHF (congestive heart failure)   . Hypertension   . Seizures   . Stroke   . Depression   . Anxiety    AXIS IV:  problems related to social environment AXIS V:  21-30 behavior considerably influenced by delusions or hallucinations OR serious impairment in judgment, communication OR inability to function in almost all areas  Plan:  Recommend psychiatric Inpatient admission when medically cleared.  Subjective:   Lori [place] is a 62 y.o. female patient admitted with depression and anxiety. Pt is tearful. Pt reports feeling scared of everything, and feels like she is having a panic attack all the time. She reports being scared of being alone. She reports symptoms for 2 wks of worsening anxiety. Stressor is daughter went to [place] to get a job. Poor sleep/appetite/energy/concentration. She reports continued thoughts of "getting rid of myself" and "why am I here?". No current plan/intent to hurt herself. No HI/AVH. She reports making a comment about cutting herself with a razor blade last week, but did not do it.   HPI:  She admits to overdosing on pills 5 times 10 years ago, h/o admission to [facility]. Saw PCP earlier this week (Dr. Marguerite Olea in [place]), who prescribed xanax 0.25 mg bid (ineffective) and seroquel 100 mg qhs.  HPI Elements:   Location:  depression/anxiety. Quality:  sever. Severity:  severe. Timing:  2 wks. Duration:  2 wks. Context:  daughter out of town.  Past Psychiatric History: Past Medical History  Diagnosis Date  . COPD (chronic obstructive pulmonary disease)   . CHF (congestive heart failure)   . Hypertension    . Seizures   . Stroke   . Depression   . Anxiety     reports that she has been smoking Cigarettes.  She has been smoking about 2.00 packs per day. She does not have any smokeless tobacco history on file. She reports that she does not drink alcohol or use illicit drugs. History reviewed. No pertinent family history. Family History Substance Abuse: No Family Supports: No (None Reported ) Living Arrangements: Alone Can pt return to current living arrangement?: Yes Abuse/Neglect [facility]) Physical Abuse: Denies Verbal Abuse: Denies Sexual Abuse: Denies Allergies:   Allergies  Allergen Reactions  . Adhesive [Tape]     Blisters  . Levaquin [Levofloxacin] Nausea And Vomiting  . Penicillins Rash  . Sulfa Antibiotics Rash    ACT Assessment Complete:  Yes:    Educational Status    Risk to Self: Risk to self Suicidal Ideation: Yes-Currently Present Suicidal Intent: No-Not Currently/Within Last 6 Months Is patient at risk for suicide?: Yes Suicidal Plan?: No-Not Currently/Within Last 6 Months Access to Means: Yes Specify Access to Suicidal Means: Pills, sharps, razors, knives  What has been your use of drugs/alcohol within the last 12 months?: Pt denies  Previous Attempts/Gestures: Yes How many times?: 5 Other Self Harm Risks: None Triggers for Past Attempts: Unpredictable Intentional Self Injurious Behavior: None Family Suicide History: No Recent stressful life event(s): Loss (Comment);Other (Comment) (Health; daughter moved away; relocation fell through ) Persecutory voices/beliefs?: No Depression: Yes Depression Symptoms: Insomnia;Tearfulness;Isolating;Loss of interest in usual pleasures;Feeling worthless/self pity  Substance abuse history and/or treatment for substance abuse?: No Suicide prevention information given to non-admitted patients: Not applicable  Risk to Others: Risk to Others Homicidal Ideation: No Thoughts of Harm to Others: No Current Homicidal Intent:  No Current Homicidal Plan: No Access to Homicidal Means: No Identified Victim: None History of harm to others?: No Assessment of Violence: None Noted Violent Behavior Description: None Does patient have access to weapons?: No Criminal Charges Pending?: No Does patient have a court date: No  Abuse: Abuse/Neglect Assessment (Assessment to be complete while patient is alone) Physical Abuse: Denies Verbal Abuse: Denies Sexual Abuse: Denies Exploitation of patient/patient's resources: Denies Self-Neglect: Denies  Prior Inpatient Therapy: Prior Inpatient Therapy Prior Inpatient Therapy: Yes Prior Therapy Dates: Unk  Prior Therapy Facilty/Provider(s): [facility]; [facility], [facility]  Reason for Treatment: Depression/SI  Prior Outpatient Therapy: Prior Outpatient Therapy Prior Outpatient Therapy: Yes Prior Therapy Dates: 12/01/13 Prior Therapy Facilty/Provider(s): Unk provider  Reason for Treatment: Med Mgt   Additional Information: Additional Information 1:1 In Past 12 Months?: No CIRT Risk: No Elopement Risk: No Does patient have medical clearance?: Yes                  Objective: Blood pressure 157/93, pulse 90, temperature 98.8 F (37.1 C), temperature source Oral, resp. rate 22, height 5\' 3"  (1.6 m), weight 67.586 kg (149 lb), SpO2 98.00%.Body mass index is 26.4 kg/(m^2). Results for orders placed during the hospital encounter of 11/19/13 (from the past 72 hour(s))  URINE RAPID DRUG SCREEN (HOSP PERFORMED)     Status: Abnormal   Collection Time    11/19/13  3:14 PM      Result Value Range   Opiates NONE DETECTED  NONE DETECTED   Cocaine NONE DETECTED  NONE DETECTED   Benzodiazepines POSITIVE (*) NONE DETECTED   Amphetamines NONE DETECTED  NONE DETECTED   Tetrahydrocannabinol NONE DETECTED  NONE DETECTED   Barbiturates NONE DETECTED  NONE DETECTED   Comment:            DRUG SCREEN FOR MEDICAL PURPOSES     ONLY.  IF CONFIRMATION IS NEEDED     FOR  ANY PURPOSE, NOTIFY LAB     WITHIN 5 DAYS.                LOWEST DETECTABLE LIMITS     FOR URINE DRUG SCREEN     Drug Class       Cutoff (ng/mL)     Amphetamine      1000     Barbiturate      200     Benzodiazepine   200     Tricyclics       300     Opiates          300     Cocaine          300     THC              50  URINALYSIS, ROUTINE W REFLEX MICROSCOPIC     Status: None   Collection Time    11/19/13  3:14 PM      Result Value Range   Color, Urine YELLOW  YELLOW   APPearance CLEAR  CLEAR   Specific Gravity, Urine 1.019  1.005 - 1.030   pH 6.5  5.0 - 8.0   Glucose, UA NEGATIVE  NEGATIVE mg/dL   Hgb urine dipstick NEGATIVE  NEGATIVE   Bilirubin Urine NEGATIVE  NEGATIVE  Ketones, ur NEGATIVE  NEGATIVE mg/dL   Protein, ur NEGATIVE  NEGATIVE mg/dL   Urobilinogen, UA 0.2  0.0 - 1.0 mg/dL   Nitrite NEGATIVE  NEGATIVE   Leukocytes, UA NEGATIVE  NEGATIVE   Comment: MICROSCOPIC NOT DONE ON URINES WITH NEGATIVE PROTEIN, BLOOD, LEUKOCYTES, NITRITE, OR GLUCOSE <1000 mg/dL.  CBC WITH DIFFERENTIAL     Status: Abnormal   Collection Time    11/19/13  3:50 PM      Result Value Range   WBC 12.1 (*) 4.0 - 10.5 K/uL   RBC 4.38  3.87 - 5.11 MIL/uL   Hemoglobin 13.6  12.0 - 15.0 g/dL   HCT 16.1  09.6 - 04.5 %   MCV 88.4  78.0 - 100.0 fL   MCH 31.1  26.0 - 34.0 pg   MCHC 35.1  30.0 - 36.0 g/dL   RDW 40.9  81.1 - 91.4 %   Platelets 221  150 - 400 K/uL   Neutrophils Relative % 67  43 - 77 %   Neutro Abs 8.1 (*) 1.7 - 7.7 K/uL   Lymphocytes Relative 25  12 - 46 %   Lymphs Abs 3.0  0.7 - 4.0 K/uL   Monocytes Relative 6  3 - 12 %   Monocytes Absolute 0.8  0.1 - 1.0 K/uL   Eosinophils Relative 2  0 - 5 %   Eosinophils Absolute 0.3  0.0 - 0.7 K/uL   Basophils Relative 0  0 - 1 %   Basophils Absolute 0.1  0.0 - 0.1 K/uL  COMPREHENSIVE METABOLIC PANEL     Status: Abnormal   Collection Time    11/19/13  3:50 PM      Result Value Range   Sodium 136  135 - 145 mEq/L   Potassium 3.4 (*)  3.5 - 5.1 mEq/L   Chloride 103  96 - 112 mEq/L   CO2 21  19 - 32 mEq/L   Glucose, Bld 118 (*) 70 - 99 mg/dL   BUN 11  6 - 23 mg/dL   Creatinine, Ser 7.82  0.50 - 1.10 mg/dL   Calcium 9.6  8.4 - 95.6 mg/dL   Total Protein 7.0  6.0 - 8.3 g/dL   Albumin 3.8  3.5 - 5.2 g/dL   AST 12  0 - 37 U/L   ALT 13  0 - 35 U/L   Alkaline Phosphatase 158 (*) 39 - 117 U/L   Total Bilirubin 0.3  0.3 - 1.2 mg/dL   GFR calc non Af Amer >90  >90 mL/min   GFR calc Af Amer >90  >90 mL/min   Comment: (NOTE)     The eGFR has been calculated using the CKD EPI equation.     This calculation has not been validated in all clinical situations.     eGFR's persistently <90 mL/min signify possible Chronic Kidney     Disease.  ETHANOL     Status: None   Collection Time    11/19/13  3:50 PM      Result Value Range   Alcohol, Ethyl (B) <11  0 - 11 mg/dL   Comment:            LOWEST DETECTABLE LIMIT FOR     SERUM ALCOHOL IS 11 mg/dL     FOR MEDICAL PURPOSES ONLY   Labs are reviewed and are pertinent for WBC and glucose elevated.  Current Facility-Administered Medications  Medication Dose Route Frequency Provider Last Rate Last Dose  . acetaminophen (  TYLENOL) tablet 650 mg  650 mg Oral Q4H PRN Mora Bellman, PA-C   650 mg at 11/19/13 1717  . alum & mag hydroxide-simeth (MAALOX/MYLANTA) 200-200-20 MG/5ML suspension 30 mL  30 mL Oral PRN Mora Bellman, PA-C      . amLODipine (NORVASC) tablet 10 mg  10 mg Oral Daily Loren Racer, MD   10 mg at 11/20/13 1191  . atorvastatin (LIPITOR) tablet 20 mg  20 mg Oral q1800 Loren Racer, MD      . azithromycin [facility]) tablet 250 mg  250 mg Oral Q0600 Mora Bellman, PA-C   250 mg at 11/20/13 4782  . clopidogrel (PLAVIX) tablet 75 mg  75 mg Oral Q breakfast Loren Racer, MD   75 mg at 11/20/13 9562  . diltiazem (CARDIZEM CD) 24 hr capsule 120 mg  120 mg Oral Daily Loren Racer, MD   120 mg at 11/20/13 0952  . gabapentin (NEURONTIN) capsule 600 mg  600 mg  Oral TID Loren Racer, MD   600 mg at 11/20/13 1308  . ibuprofen (ADVIL,MOTRIN) tablet 400 mg  400 mg Oral Q6H PRN Loren Racer, MD      . ibuprofen (ADVIL,MOTRIN) tablet 600 mg  600 mg Oral Q8H PRN Mora Bellman, PA-C   600 mg at 11/20/13 1134  . LORazepam (ATIVAN) tablet 1 mg  1 mg Oral Q8H PRN Mora Bellman, PA-C   1 mg at 11/20/13 6578  . metoprolol succinate (TOPROL-XL) 24 hr tablet 25 mg  25 mg Oral Daily Loren Racer, MD   25 mg at 11/20/13 4696  . mometasone-formoterol (DULERA) 100-5 MCG/ACT inhaler 2 puff  2 puff Inhalation BID Loren Racer, MD      . nicotine polacrilex (NICORETTE) gum 2 mg  2 mg Oral PRN Mora Bellman, PA-C      . ondansetron [facility]) tablet 4 mg  4 mg Oral Q8H PRN Mora Bellman, PA-C      . pantoprazole (PROTONIX) EC tablet 40 mg  40 mg Oral Daily Loren Racer, MD   40 mg at 11/20/13 1042  . phenytoin (DILANTIN) ER capsule 200 mg  200 mg Oral TID Loren Racer, MD   200 mg at 11/20/13 2952  . QUEtiapine (SEROQUEL) tablet 100 mg  100 mg Oral QHS Loren Racer, MD      . tiotropium [facility]) inhalation capsule 18 mcg  18 mcg Inhalation Daily Loren Racer, MD      . zolpidem [facility]) tablet 5 mg  5 mg Oral QHS PRN Mora Bellman, PA-C   5 mg at 11/19/13 2231   Current Outpatient Prescriptions  Medication Sig Dispense Refill  . ALPRAZolam (XANAX) 0.25 MG tablet Take 0.25 mg by mouth 2 (two) times daily as needed for anxiety.      Marland Kitchen amLODipine (NORVASC) 10 MG tablet Take 10 mg by mouth daily.      . clopidogrel (PLAVIX) 75 MG tablet Take 75 mg by mouth daily with breakfast.      . diltiazem (CARDIZEM CD) 120 MG 24 hr capsule Take 120 mg by mouth daily.      . Fluticasone-Salmeterol (ADVAIR) 250-50 MCG/DOSE AEPB Inhale 1 puff into the lungs 2 (two) times daily.      Marland Kitchen gabapentin (NEURONTIN) 300 MG capsule Take 600 mg by mouth 3 (three) times daily.      Marland Kitchen ibuprofen (ADVIL,MOTRIN) 200 MG tablet Take 400 mg by mouth every 6 (six) hours as  needed.      Marland Kitchen  metoprolol succinate (TOPROL-XL) 25 MG 24 hr tablet Take 25 mg by mouth daily.      . pantoprazole (PROTONIX) 40 MG tablet Take 40 mg by mouth daily.      . phenytoin (DILANTIN) 100 MG ER capsule Take 200 mg by mouth 3 (three) times daily.      . QUEtiapine (SEROQUEL) 100 MG tablet Take 100 mg by mouth at bedtime.      . rosuvastatin (CRESTOR) 10 MG tablet Take 10 mg by mouth daily.      Marland Kitchen tiotropium (SPIRIVA) 18 MCG inhalation capsule Place 18 mcg into inhaler and inhale daily.        Psychiatric Specialty Exam:     Blood pressure 157/93, pulse 90, temperature 98.8 F (37.1 C), temperature source Oral, resp. rate 22, height 5\' 3"  (1.6 m), weight 67.586 kg (149 lb), SpO2 98.00%.Body mass index is 26.4 kg/(m^2).  General Appearance: Disheveled and Guarded  Eye Contact::  Poor  Speech:  Slow  Volume:  Decreased  Mood:  Depressed  Affect:  Depressed  Thought Process:  Goal Directed  Orientation:  Full (Time, Place, and Person)  Thought Content:  Hallucinations: None  Suicidal Thoughts:  Yes.  without intent/plan  Homicidal Thoughts:  No  Memory:  Negative  Judgement:  Poor  Insight:  Shallow  Psychomotor Activity:  Decreased  Concentration:  Poor  Recall:  Fair  Akathisia:  Negative  Handed:  Right  AIMS (if indicated):     Assets:  Desire for Improvement  Sleep:      Treatment Plan Summary: Daily contact with patient to assess and evaluate symptoms and progress in treatment Medication management Will pursue inpatient placement for safety/stabilization. ativan 1 mg prn anxiety given this AM.  Tawni Carnes, Marguerita Stapp 11/20/2013 1:21 PM

## 2013-11-21 ENCOUNTER — Emergency Department (HOSPITAL_COMMUNITY): Payer: Medicare Other

## 2013-11-21 DIAGNOSIS — F411 Generalized anxiety disorder: Secondary | ICD-10-CM

## 2013-11-21 DIAGNOSIS — F329 Major depressive disorder, single episode, unspecified: Secondary | ICD-10-CM

## 2013-11-21 MED ORDER — ESCITALOPRAM OXALATE 10 MG PO TABS
5.0000 mg | ORAL_TABLET | Freq: Every day | ORAL | Status: DC
  Administered 2013-11-21 – 2013-11-25 (×5): 5 mg via ORAL
  Filled 2013-11-21 (×2): qty 1
  Filled 2013-11-21: qty 0.5
  Filled 2013-11-21 (×2): qty 1
  Filled 2013-11-21: qty 0.5

## 2013-11-21 NOTE — Progress Notes (Addendum)
CSW faxed completed application packet to [facility]. Per Dorathy Daft, team has not had chance to review faxed applications. CSW awaiting call from [facility] re: another pt, so CSW will f/u then.  [place] Spaniel [place], [phone]     ED CSW  12:13pm ______________  CSW spoke with Psych EDP Pollina to order chest x-ray and EKG, which are required for [facility] admissions application.   CSW will send application packet to [place] once these tests are completed.   [place] Spaniel [place], [phone]     ED CSW

## 2013-11-21 NOTE — Progress Notes (Signed)
Patient ID: Lori Cooke, female   DOB: July 17, 1951, 62 y.o.   MRN: [id number] Psychiatric Specialty Exam: Physical Exam  ROS  Blood pressure 119/68, pulse 85, temperature 98.6 F (37 C), temperature source Oral, resp. rate 18, height 5\' 3"  (1.6 m), weight 67.586 kg (149 lb), SpO2 99.00%.Body mass index is 26.4 kg/(m^2).  General Appearance: Casual  Eye Contact::  Good  Speech:  Clear and Coherent  Volume:  Normal  Mood:  Angry, Anxious, Depressed, Hopeless and Irritable  Affect:  Congruent, Depressed, Flat, Labile and Tearful  Thought Process:  Coherent, Goal Directed and Intact  Orientation:  Full (Time, Place, and Person)  Thought Content:  NA  Suicidal Thoughts:  No  Homicidal Thoughts:  No  Memory:  Immediate;   Fair Recent;   Fair Remote;   Fair  Judgement:  Fair  Insight:  Good  Psychomotor Activity:  Normal  Concentration:  Good  Recall:  Poor  Akathisia:  NA  Handed:  Right  AIMS (if indicated):     Assets:  Desire for Improvement  Sleep:      Seen this am with Dr Lolly Mustache and this Clinical research associate.  Patient was tearful during the interview.  Patient reports increased panic attacks in the past few weeks.  She reports her doctor have tried her on several medications and nothing seem to help.  She reports frequent crying at home, poor sleep and appetite.  Patient lives alone and has no contact with her only daughter who live in [place].    Patient states she is not ready to go home without help with depression and anxiety.  We will add Lexapro to help with her anxiety and depression. Plan: Lexapro 5 mg po daily We will continue to wait for acceptance at any of the hospitals with available beds.  Dahlia Byes   PMHNP-BC  I have personally seen the patient and agreed with the findings and involved in the treatment plan. Kathryne Sharper, MD

## 2013-11-21 NOTE — ED Notes (Signed)
Patient recd scheduled Medication and refused to take 600 mg of Neurontin. Only took 300 mg - patient states it make her head hurt.

## 2013-11-21 NOTE — ED Notes (Signed)
Bed: WA17 Expected date:  Expected time:  Means of arrival:  Comments: [facility]cu hold

## 2013-11-21 NOTE — ED Notes (Signed)
Patient unable to transfer to Psych ED . Patient is O2 depended at night.

## 2013-11-21 NOTE — ED Notes (Signed)
Discussed with Pam RN the possibility of patient being transferred to psychED. It was agreed that patient could not be transferred due to her need for O2 per HS.

## 2013-11-22 ENCOUNTER — Encounter (HOSPITAL_COMMUNITY): Payer: Self-pay | Admitting: Registered Nurse

## 2013-11-22 DIAGNOSIS — F332 Major depressive disorder, recurrent severe without psychotic features: Secondary | ICD-10-CM

## 2013-11-22 DIAGNOSIS — F419 Anxiety disorder, unspecified: Secondary | ICD-10-CM | POA: Diagnosis present

## 2013-11-22 NOTE — Progress Notes (Signed)
Writer contacted the following facilities to follow up on pt. Referral:  [facility]- Per Liborio Nixon, referral was not received, one gero bed available, writer will refax pt. Referral  [facility]- Per Misty Stanley, no beds available, dr. Stann Mainland review tomorrow [facility]- Per Randa Evens, at capacity, possible dc's in the am [facility]- Per Marijean Niemann, at capacity [facility]- Per Shanda Bumps, at capacity, possible dc's tomorrow [facility]- Per [place], no adult beds available [facility]- No answer  Rodman Pickle, MHT

## 2013-11-22 NOTE — ED Notes (Signed)
Patient called out and stated, "I feel like slapping somebody and I know it is the Lexapro.". Patient demanded that the psychiatrist be called. Psych PA notified. No new orders.

## 2013-11-22 NOTE — Consult Note (Addendum)
Psychiatry Progress Note   Reason for Consult:  depression Referring Physician:  EDP  Lori Cooke is an 62 y.o. female.  Assessment: AXIS I:  Anxiety Disorder NOS and Major Depression, Recurrent severe AXIS II:  Deferred AXIS III:   Past Medical History  Diagnosis Date  . COPD (chronic obstructive pulmonary disease)   . CHF (congestive heart failure)   . Hypertension   . Seizures   . Stroke   . Depression   . Anxiety    AXIS IV:  problems related to social environment AXIS V:  21-30 behavior considerably influenced by delusions or hallucinations OR serious impairment in judgment, communication OR inability to function in almost all areas  Plan:  Recommend psychiatric Inpatient admission when medically cleared.  Subjective:   Lori Cooke is a 62 y.o. female patient.  Patient states that she is feeling a little better.  "I'm getting better, no more crying.  I slept finally.  Still depressed it's just I don't have the feeling of fearness of being alone; that's gone.  Anxiety is still the same."  Patient denies suicidal ideation, homicidal ideation, and psychosis.  Patient denies any adverse reaction to Lexapro that was started yesterday.     HPI Elements:   Location:  depression/anxiety. Quality:  sever. Severity:  severe. Timing:  2 wks. Duration:  2 wks. Context:  daughter out of town.  Past Psychiatric History: Past Medical History  Diagnosis Date  . COPD (chronic obstructive pulmonary disease)   . CHF (congestive heart failure)   . Hypertension   . Seizures   . Stroke   . Depression   . Anxiety     reports that she has been smoking Cigarettes.  She has been smoking about 2.00 packs per day. She does not have any smokeless tobacco history on file. She reports that she does not drink alcohol or use illicit drugs. History reviewed. No pertinent family history. Family History Substance Abuse: No Family Supports: No (None Reported ) Living Arrangements:  Alone Can pt return to current living arrangement?: Yes Abuse/Neglect [facility]) Physical Abuse: Denies Verbal Abuse: Denies Sexual Abuse: Denies Allergies:   Allergies  Allergen Reactions  . Adhesive [Tape]     Blisters  . Levaquin [Levofloxacin] Nausea And Vomiting  . Penicillins Rash  . Sulfa Antibiotics Rash    ACT Assessment Complete:  Yes:    Educational Status    Risk to Self: Risk to self Suicidal Ideation: Yes-Currently Present Suicidal Intent: No-Not Currently/Within Last 6 Months Is patient at risk for suicide?: Yes Suicidal Plan?: No-Not Currently/Within Last 6 Months Access to Means: Yes Specify Access to Suicidal Means: Pills, sharps, razors, knives  What has been your use of drugs/alcohol within the last 12 months?: Pt denies  Previous Attempts/Gestures: Yes How many times?: 5 Other Self Harm Risks: None Triggers for Past Attempts: Unpredictable Intentional Self Injurious Behavior: None Family Suicide History: No Recent stressful life event(s): Loss (Comment);Other (Comment) (Health; daughter moved away; relocation fell through ) Persecutory voices/beliefs?: No Depression: Yes Depression Symptoms: Insomnia;Tearfulness;Isolating;Loss of interest in usual pleasures;Feeling worthless/self pity Substance abuse history and/or treatment for substance abuse?: No Suicide prevention information given to non-admitted patients: Not applicable  Risk to Others: Risk to Others Homicidal Ideation: No Thoughts of Harm to Others: No Current Homicidal Intent: No Current Homicidal Plan: No Access to Homicidal Means: No Identified Victim: None History of harm to others?: No Assessment of Violence: None Noted Violent Behavior Description: None Does patient have access to  weapons?: No Criminal Charges Pending?: No Does patient have a court date: No  Abuse: Abuse/Neglect Assessment (Assessment to be complete while patient is alone) Physical Abuse: Denies Verbal Abuse:  Denies Sexual Abuse: Denies Exploitation of patient/patient's resources: Denies Self-Neglect: Denies  Prior Inpatient Therapy: Prior Inpatient Therapy Prior Inpatient Therapy: Yes Prior Therapy Dates: Unk  Prior Therapy Facilty/Provider(s): [facility], [facility]  Reason for Treatment: Depression/SI  Prior Outpatient Therapy: Prior Outpatient Therapy Prior Outpatient Therapy: Yes Prior Therapy Dates: 12/01/13 Prior Therapy Facilty/Provider(s): Unk provider  Reason for Treatment: Med Mgt   Additional Information: Additional Information 1:1 In Past 12 Months?: No CIRT Risk: No Elopement Risk: No Does patient have medical clearance?: Yes                  Objective: Blood pressure 115/70, pulse 76, temperature 97.6 F (36.4 C), temperature source Oral, resp. rate 18, height 5\' 3"  (1.6 m), weight 67.586 kg (149 lb), SpO2 99.00%.Body mass index is 26.4 kg/(m^2). Results for orders placed during the hospital encounter of 11/19/13 (from the past 72 hour(s))  URINE RAPID DRUG SCREEN (HOSP PERFORMED)     Status: Abnormal   Collection Time    11/19/13  3:14 PM      Result Value Range   Opiates NONE DETECTED  NONE DETECTED   Cocaine NONE DETECTED  NONE DETECTED   Benzodiazepines POSITIVE (*) NONE DETECTED   Amphetamines NONE DETECTED  NONE DETECTED   Tetrahydrocannabinol NONE DETECTED  NONE DETECTED   Barbiturates NONE DETECTED  NONE DETECTED   Comment:            DRUG SCREEN FOR MEDICAL PURPOSES     ONLY.  IF CONFIRMATION IS NEEDED     FOR ANY PURPOSE, NOTIFY LAB     WITHIN 5 DAYS.                LOWEST DETECTABLE LIMITS     FOR URINE DRUG SCREEN     Drug Class       Cutoff (ng/mL)     Amphetamine      1000     Barbiturate      200     Benzodiazepine   200     Tricyclics       300     Opiates          300     Cocaine          300     THC              50  URINALYSIS, ROUTINE W REFLEX MICROSCOPIC     Status: None   Collection Time    11/19/13   3:14 PM      Result Value Range   Color, Urine YELLOW  YELLOW   APPearance CLEAR  CLEAR   Specific Gravity, Urine 1.019  1.005 - 1.030   pH 6.5  5.0 - 8.0   Glucose, UA NEGATIVE  NEGATIVE mg/dL   Hgb urine dipstick NEGATIVE  NEGATIVE   Bilirubin Urine NEGATIVE  NEGATIVE   Ketones, ur NEGATIVE  NEGATIVE mg/dL   Protein, ur NEGATIVE  NEGATIVE mg/dL   Urobilinogen, UA 0.2  0.0 - 1.0 mg/dL   Nitrite NEGATIVE  NEGATIVE   Leukocytes, UA NEGATIVE  NEGATIVE   Comment: MICROSCOPIC NOT DONE ON URINES WITH NEGATIVE PROTEIN, BLOOD, LEUKOCYTES, NITRITE, OR GLUCOSE <1000 mg/dL.  CBC WITH DIFFERENTIAL     Status: Abnormal   Collection  Time    11/19/13  3:50 PM      Result Value Range   WBC 12.1 (*) 4.0 - 10.5 K/uL   RBC 4.38  3.87 - 5.11 MIL/uL   Hemoglobin 13.6  12.0 - 15.0 g/dL   HCT 40.9  81.1 - 91.4 %   MCV 88.4  78.0 - 100.0 fL   MCH 31.1  26.0 - 34.0 pg   MCHC 35.1  30.0 - 36.0 g/dL   RDW 78.2  95.6 - 21.3 %   Platelets 221  150 - 400 K/uL   Neutrophils Relative % 67  43 - 77 %   Neutro Abs 8.1 (*) 1.7 - 7.7 K/uL   Lymphocytes Relative 25  12 - 46 %   Lymphs Abs 3.0  0.7 - 4.0 K/uL   Monocytes Relative 6  3 - 12 %   Monocytes Absolute 0.8  0.1 - 1.0 K/uL   Eosinophils Relative 2  0 - 5 %   Eosinophils Absolute 0.3  0.0 - 0.7 K/uL   Basophils Relative 0  0 - 1 %   Basophils Absolute 0.1  0.0 - 0.1 K/uL  COMPREHENSIVE METABOLIC PANEL     Status: Abnormal   Collection Time    11/19/13  3:50 PM      Result Value Range   Sodium 136  135 - 145 mEq/L   Potassium 3.4 (*) 3.5 - 5.1 mEq/L   Chloride 103  96 - 112 mEq/L   CO2 21  19 - 32 mEq/L   Glucose, Bld 118 (*) 70 - 99 mg/dL   BUN 11  6 - 23 mg/dL   Creatinine, Ser 0.86  0.50 - 1.10 mg/dL   Calcium 9.6  8.4 - 57.8 mg/dL   Total Protein 7.0  6.0 - 8.3 g/dL   Albumin 3.8  3.5 - 5.2 g/dL   AST 12  0 - 37 U/L   ALT 13  0 - 35 U/L   Alkaline Phosphatase 158 (*) 39 - 117 U/L   Total Bilirubin 0.3  0.3 - 1.2 mg/dL   GFR calc non Af  Amer >90  >90 mL/min   GFR calc Af Amer >90  >90 mL/min   Comment: (NOTE)     The eGFR has been calculated using the CKD EPI equation.     This calculation has not been validated in all clinical situations.     eGFR's persistently <90 mL/min signify possible Chronic Kidney     Disease.  ETHANOL     Status: None   Collection Time    11/19/13  3:50 PM      Result Value Range   Alcohol, Ethyl (B) <11  0 - 11 mg/dL   Comment:            LOWEST DETECTABLE LIMIT FOR     SERUM ALCOHOL IS 11 mg/dL     FOR MEDICAL PURPOSES ONLY   Labs are reviewed and are pertinent for WBC and glucose elevated.  Current Facility-Administered Medications  Medication Dose Route Frequency Provider Last Rate Last Dose  . acetaminophen (TYLENOL) tablet 650 mg  650 mg Oral Q4H PRN Mora Bellman, PA-C   650 mg at 11/21/13 1341  . alum & mag hydroxide-simeth (MAALOX/MYLANTA) 200-200-20 MG/5ML suspension 30 mL  30 mL Oral PRN Mora Bellman, PA-C      . amLODipine (NORVASC) tablet 10 mg  10 mg Oral Daily Loren Racer, MD   10 mg at 11/21/13 1115  .  atorvastatin (LIPITOR) tablet 20 mg  20 mg Oral q1800 Loren Racer, MD   20 mg at 11/21/13 1752  . azithromycin (ZITHROMAX) tablet 250 mg  250 mg Oral Q0600 Mora Bellman, PA-C   250 mg at 11/22/13 0719  . clopidogrel (PLAVIX) tablet 75 mg  75 mg Oral Q breakfast Loren Racer, MD   75 mg at 11/22/13 0734  . diltiazem (CARDIZEM CD) 24 hr capsule 120 mg  120 mg Oral Daily Loren Racer, MD   120 mg at 11/21/13 1112  . escitalopram (LEXAPRO) tablet 5 mg  5 mg Oral Daily Earney Navy, NP   5 mg at 11/22/13 1010  . gabapentin (NEURONTIN) capsule 600 mg  600 mg Oral TID Loren Racer, MD   600 mg at 11/22/13 1012  . ibuprofen (ADVIL,MOTRIN) tablet 600 mg  600 mg Oral Q8H PRN Mora Bellman, PA-C   600 mg at 11/22/13 0719  . LORazepam (ATIVAN) tablet 1 mg  1 mg Oral Q8H PRN Mora Bellman, PA-C   1 mg at 11/22/13 1610  . metoprolol succinate  (TOPROL-XL) 24 hr tablet 25 mg  25 mg Oral Daily Loren Racer, MD   25 mg at 11/21/13 1113  . mometasone-formoterol (DULERA) 100-5 MCG/ACT inhaler 2 puff  2 puff Inhalation BID Loren Racer, MD   2 puff at 11/22/13 0904  . nicotine polacrilex (NICORETTE) gum 2 mg  2 mg Oral PRN Mora Bellman, PA-C      . ondansetron [facility]) tablet 4 mg  4 mg Oral Q8H PRN Mora Bellman, PA-C      . pantoprazole (PROTONIX) EC tablet 40 mg  40 mg Oral Daily Loren Racer, MD   40 mg at 11/22/13 1013  . phenytoin (DILANTIN) ER capsule 200 mg  200 mg Oral TID Loren Racer, MD   200 mg at 11/22/13 1013  . QUEtiapine (SEROQUEL) tablet 100 mg  100 mg Oral QHS Loren Racer, MD   100 mg at 11/21/13 2202  . tiotropium (SPIRIVA) inhalation capsule 18 mcg  18 mcg Inhalation Daily Loren Racer, MD   18 mcg at 11/22/13 0905  . zolpidem (AMBIEN) tablet 5 mg  5 mg Oral QHS PRN Mora Bellman, PA-C   5 mg at 11/20/13 2352   Current Outpatient Prescriptions  Medication Sig Dispense Refill  . ALPRAZolam (XANAX) 0.25 MG tablet Take 0.25 mg by mouth 2 (two) times daily as needed for anxiety.      Marland Kitchen amLODipine (NORVASC) 10 MG tablet Take 10 mg by mouth daily.      . clopidogrel (PLAVIX) 75 MG tablet Take 75 mg by mouth daily with breakfast.      . diltiazem (CARDIZEM CD) 120 MG 24 hr capsule Take 120 mg by mouth daily.      . Fluticasone-Salmeterol (ADVAIR) 250-50 MCG/DOSE AEPB Inhale 1 puff into the lungs 2 (two) times daily.      Marland Kitchen gabapentin (NEURONTIN) 300 MG capsule Take 600 mg by mouth 3 (three) times daily.      Marland Kitchen ibuprofen (ADVIL,MOTRIN) 200 MG tablet Take 400 mg by mouth every 6 (six) hours as needed.      . metoprolol succinate (TOPROL-XL) 25 MG 24 hr tablet Take 25 mg by mouth daily.      . pantoprazole (PROTONIX) 40 MG tablet Take 40 mg by mouth daily.      . phenytoin (DILANTIN) 100 MG ER capsule Take 200 mg by mouth 3 (three) times daily.      Marland Kitchen  QUEtiapine (SEROQUEL) 100 MG tablet Take 100 mg  by mouth at bedtime.      . rosuvastatin (CRESTOR) 10 MG tablet Take 10 mg by mouth daily.      Marland Kitchen tiotropium (SPIRIVA) 18 MCG inhalation capsule Place 18 mcg into inhaler and inhale daily.        Psychiatric Specialty Exam:     Blood pressure 115/70, pulse 76, temperature 97.6 F (36.4 C), temperature source Oral, resp. rate 18, height 5\' 3"  (1.6 m), weight 67.586 kg (149 lb), SpO2 99.00%.Body mass index is 26.4 kg/(m^2).  General Appearance: Disheveled and Guarded  Eye Contact::  Poor  Speech:  Slow  Volume:  Decreased  Mood:  Depressed  Affect:  Depressed  Thought Process:  Goal Directed  Orientation:  Full (Time, Place, and Person)  Thought Content:  Hallucinations: None  Suicidal Thoughts:  Yes.  without intent/plan  Homicidal Thoughts:  No  Memory:  Negative  Judgement:  Poor  Insight:  Shallow  Psychomotor Activity:  Decreased  Concentration:  Poor  Recall:  Fair  Akathisia:  Negative  Handed:  Right  AIMS (if indicated):     Assets:  Desire for Improvement  Sleep:      Face to face interview/consult with Dr. Lolly Mustache  Treatment Plan Summary: Daily contact with patient to assess and evaluate symptoms and progress in treatment Medication management Will pursue inpatient placement for safety/stabilization. ativan 1 mg prn anxiety given this AM.  Rankin, Shuvon, FNP-BC 11/22/2013 11:29 AM  I have personally seen the patient and agreed with the findings and involved in the treatment plan. Kathryne Sharper, MD

## 2013-11-22 NOTE — ED Notes (Signed)
Patient called out and stated, "I better get something or I am going to act a fool." Again explained to patient was relayed from the psych PA regarding the need to let Lexapro get into her system.

## 2013-11-22 NOTE — ED Notes (Signed)
Pt belongings in locker #29. 

## 2013-11-22 NOTE — ED Notes (Signed)
Psychiatrist in to see patient

## 2013-11-22 NOTE — ED Notes (Signed)
Patient said she feeling like she won't to slap somebody.Patient said the meds making her feel that way. I let the nurse known

## 2013-11-23 MED ORDER — HYDROXYZINE HCL 25 MG PO TABS
50.0000 mg | ORAL_TABLET | Freq: Three times a day (TID) | ORAL | Status: DC | PRN
  Administered 2013-11-23 – 2013-11-25 (×3): 50 mg via ORAL
  Filled 2013-11-23 (×3): qty 2

## 2013-11-23 MED ORDER — IPRATROPIUM BROMIDE 0.02 % IN SOLN
0.5000 mg | Freq: Once | RESPIRATORY_TRACT | Status: AC
  Administered 2013-11-23: 0.5 mg via RESPIRATORY_TRACT
  Filled 2013-11-23: qty 2.5

## 2013-11-23 MED ORDER — LEVALBUTEROL HCL 0.63 MG/3ML IN NEBU
0.6300 mg | INHALATION_SOLUTION | Freq: Once | RESPIRATORY_TRACT | Status: AC
  Administered 2013-11-23: 0.63 mg via RESPIRATORY_TRACT
  Filled 2013-11-23: qty 3

## 2013-11-23 NOTE — Progress Notes (Signed)
Pt was seen with NP. Agree with assessment and plan.  

## 2013-11-23 NOTE — Progress Notes (Addendum)
CSW contacted Lori Cooke concerning referral. Per Victorino Dike, Pt declined by Dr. Yetta Barre due to not meeting admission criteria.  CSW contacted [facility].  Per Jennette Kettle, pt declined due to [name] is at capacity.   Marva Panda, Theresia Majors  [phone]  .11/23/2013  7:10 pm   CSW spoke with Cedric @ [facility].  No beds tonight, but expect discharges tomorrow.  CSW faxed referral for review.   Marva Panda, LCSWA  [phone] .11/23/2013  10:20 pm

## 2013-11-23 NOTE — Progress Notes (Signed)
Patient ID: Lori Cooke, female   DOB: 12-27-1951, 62 y.o.   MRN: [id number] Psychiatric Specialty Exam: Physical Exam  ROS  Blood pressure 115/75, pulse 59, temperature 98.9 F (37.2 C), temperature source Oral, resp. rate 18, height 5\' 3"  (1.6 m), weight 67.586 kg (149 lb), SpO2 96.00%.Body mass index is 26.4 kg/(m^2).  General Appearance: Casual  Eye Contact::  Good  Speech:  Clear and Coherent and Pressured  Volume:  Normal  Mood:  Angry, Anxious, Depressed, Hopeless and Irritable  Affect:  Congruent, Depressed, Flat and Tearful  Thought Process:  Coherent, Goal Directed and Intact  Orientation:  Full (Time, Place, and Person)  Thought Content:  NA  Suicidal Thoughts:  No  Homicidal Thoughts:  No  Memory:  Immediate;   Good Recent;   Good Remote;   Good  Judgement:  Fair  Insight:  Present  Psychomotor Activity:  Increased  Concentration:  Fair  Recall:  NA  Akathisia:  NA  Handed:  Right  AIMS (if indicated):     Assets:  Desire for Improvement  Sleep:      Patient was seen this am with Dr Tawni Carnes.  Patient reports good sleep but no improvement in her anxiety.  Patient was informed it will take 4-6 week for her Lexapro to relieve her depression and anxiety.  Patient is asking for Xanax but was informed that Ativan is longer acting than xanax.  We will continue to monitor patient.  We will add Vistaril to her medication Plan:  Continue with current plan-placement at any [facility] Continue with current regimen but add Vistaril 50 mg every 8 hours for anxiety.  Dahlia Byes    PMHNP-BC

## 2013-11-23 NOTE — Progress Notes (Signed)
MHT spoke with Armando Reichert at [facility], faxed EKG needed for MD review this morning.  Noted that pt is voluntary at this time and can sign herself into the facility per report from her RN.  Blain Pais, MHT/NS

## 2013-11-23 NOTE — ED Notes (Signed)
Transport request and EMTALA made in error.

## 2013-11-23 NOTE — Progress Notes (Signed)
Underwriter continued bed placement for pt.  The following hospitals were faxed with bed availability: 1)[facility]-faxed EKG and report voluntarily admission 2)[facility]  Blain Pais, MHT/NS

## 2013-11-23 NOTE — Progress Notes (Addendum)
Pt spoke with Nicholos Johns at [facility], pt referral received and pending review.Requested cxray, updated psych note, and ekg. CSW will fax.  Pt spoke with Shanda Bumps at [facility], pt referral received and pending review.  Pt spoke with Annice Pih at [facility], no bed available or expected for today.    Catha Gosselin, [place] [phone]  ED CSW .11/23/2013 10:01am

## 2013-11-23 NOTE — ED Notes (Signed)
Complaints of anxiety. Informed her that she is not able to get her Ativan until 1616. Pt states that the assessment team told her that medication would be added to help with her anxiety. Talked with team and she stated that pt could have Visteril 50 mg PO. Informed pt.

## 2013-11-23 NOTE — ED Notes (Signed)
Brought to TCU in [facility]. Calm and cooperative.

## 2013-11-24 DIAGNOSIS — R45851 Suicidal ideations: Secondary | ICD-10-CM

## 2013-11-24 LAB — COMPREHENSIVE METABOLIC PANEL
ALT: 8 U/L (ref 0–35)
AST: 9 U/L (ref 0–37)
Albumin: 3.7 g/dL (ref 3.5–5.2)
Alkaline Phosphatase: 155 U/L — ABNORMAL HIGH (ref 39–117)
BUN: 14 mg/dL (ref 6–23)
Chloride: 104 mEq/L (ref 96–112)
GFR calc Af Amer: >90 mL/min (ref >90)
Glucose, Bld: 66 mg/dL — ABNORMAL LOW (ref 70–99)
Potassium: 4.2 mEq/L (ref 3.5–5.1)
Sodium: 141 mEq/L (ref 135–145)
Total Bilirubin: 0.2 mg/dL — ABNORMAL LOW (ref 0.3–1.2)
Total Protein: 7 g/dL (ref 6.0–8.3)

## 2013-11-24 LAB — CBC WITH DIFFERENTIAL/PLATELET
Basophils Absolute: 0.1 10*3/uL (ref 0.0–0.1)
Basophils Relative: 1 % (ref 0–1)
Eosinophils Absolute: 0.2 10*3/uL (ref 0.0–0.7)
Eosinophils Relative: 2 % (ref 0–5)
Hemoglobin: 13.1 g/dL (ref 12.0–15.0)
MCH: 31 pg (ref 26.0–34.0)
MCHC: 33.9 g/dL (ref 30.0–36.0)
Monocytes Relative: 6 % (ref 3–12)
Neutro Abs: 6.2 10*3/uL (ref 1.7–7.7)
Neutrophils Relative %: 66 % (ref 43–77)
Platelets: 253 10*3/uL (ref 150–400)

## 2013-11-24 NOTE — ED Notes (Signed)
Clothes moved from locker 43 to locker 29

## 2013-11-24 NOTE — Progress Notes (Signed)
Writer consulted with the ER MD and the patient continues to meet criteria for inpatient hospitalization.  Patient reports that she is not able to contract for safety.    After the length of stay meeting Dr. Geoffry Paradise (Medical Advisor) wants to know the status of the patient receiving a bed. Writer informed the [facility] Inetta Fermo).

## 2013-11-24 NOTE — ED Notes (Signed)
Patient resting in position of comfort with eyes closed RR WNL--even and unlabored with equal rise and fall of chest Patient in NAD Side rails up, call bell in reach  

## 2013-11-24 NOTE — ED Notes (Signed)
Patient appears anxious. Denies SI, HI, AVH. Contracts for safety. Patient repeatedly asked for medications and oxygen throughout the evening. Patient requested Ambien with 2200 medications.  [facility] A/C Inetta Fermo, RN request that O2 be monitored, possibly tapered to see if patient can go without it. Patient sent to acute for oxygen at bedtime.

## 2013-11-24 NOTE — ED Notes (Signed)
Bed: UE45 Expected date:  Expected time:  Means of arrival:  Comments: From psych-ed

## 2013-11-24 NOTE — ED Notes (Signed)
Pt has meds locked up in Pharmacy

## 2013-11-24 NOTE — ED Notes (Signed)
Assumed care of patient s/p report Patient resting in position of comfort with eyes closed RR WNL--even and unlabored with equal rise and fall of chest Patient in NAD Side rails up, call bell in reach  

## 2013-11-24 NOTE — Consult Note (Signed)
  Psychiatric Specialty Exam: Physical Exam  ROS  Blood pressure 139/69, pulse 82, temperature 98.2 F (36.8 C), temperature source Oral, resp. rate 18, height 5\' 3"  (1.6 m), weight 67.586 kg (149 lb), SpO2 96.00%.Body mass index is 26.4 kg/(m^2).  General Appearance: Casual  Eye Contact::  Good  Speech:  Clear and Coherent  Volume:  Normal  Mood:  Anxious  Affect:  Congruent  Thought Process:  Coherent and Logical  Orientation:  Full (Time, Place, and Person)  Thought Content:  Negative  Suicidal Thoughts:  Yes.  without intent/plan  Homicidal Thoughts:  No  Memory:  Immediate;   Good Recent;   Good Remote;   Good  Judgement:  Intact  Insight:  Shallow  Psychomotor Activity:  Normal  Concentration:  Good  Recall:  Good  Akathisia:  Negative  Handed:  Right  AIMS (if indicated):     Assets:  Communication Skills Desire for Improvement Housing  Sleep:   adequate  Ms Wallner is still anxious.  "They haven't done anything to help me." but she said she does feel some better.  Still cannot contract for safety if she is discharged.  She lives alone [place] her home.  She moved there to be close to her daughter who then moved to [place].  Now she is alone, knows no one and feels more anxious under those circumstances.  She has been depressed for a long time.  The anxiety has increased since her daughter moved 2 months ago.  She is from [place] but cannot move there for now.  Recommend an inpatient bed because of suicidal ideation.

## 2013-11-25 MED ORDER — ESCITALOPRAM OXALATE 5 MG PO TABS: 5.0000 mg | ORAL_TABLET | Freq: Every day | ORAL | Status: DC

## 2013-11-25 MED ORDER — LORAZEPAM 1 MG PO TABS: 1.0000 mg | ORAL_TABLET | Freq: Three times a day (TID) | ORAL | Status: AC | PRN

## 2013-11-25 NOTE — Progress Notes (Signed)
   CARE MANAGEMENT ED NOTE 11/25/2013  Patient:  Lori Cooke, Lori Cooke   Account Number:  [id number]  Date Initiated:  11/25/2013  Documentation initiated by:  Edd Arbour  Subjective/Objective Assessment:   62 yr old aarp medicare complete patient ED CM consulted by ED SW for assistance with home health services for [facility] RN Pt presently [facility] with [facility]) for [facility]  pcp Deberah Pelton     Subjective/Objective Assessment Detail:   [facility] is no longer providing BH RN services Genevieve Norlander continues to offer [facility] RN services but states will only be able to provide first visit to pt on 12/07/13 Judeth Cornfield states pt [facility] services were to be d/c related to her being found to be inappropriate per the [facility] SW who saw her. Judeth Cornfield to spoke with her [facility] civil engineer to see if pt could be seen and transferred to [facility] services on 12/07/13 Pt preference is to remain with [facility] RN if she is to be seen earlier and then to receive services from [facility] if needed and available on 12/07/13 ED SW updated see below notes     Action/Plan:   CM spoke with ED SW, [facility]), Genevieve Norlander [facility] Blase) and patient   Action/Plan Detail:   EPIC updated   Anticipated DC Date:  11/25/2013     Status Recommendation to Physician:   Result of Recommendation:    Other ED Services  Consult Working Plan   In-house referral  Clinical Social Worker   DC Planning Services  Other  PCP issues   [facility] Choice  HOME HEALTH   Choice offered to / List presented to:  C-1 Patient     HH arranged  HH-1 RN  HH-4 NURSE'S AIDE  HH-10 DISEASE MANAGEMENT  HH-6 SOCIAL WORKER      HH agency  [facility]    Status of service:  Completed, signed off  ED Comments:  ED CM coordinated between [facility] and the earliest services agreed upon by pt, [facility] NP and Genevieve Norlander is for pt to be seen on 12/02/13 by Genevieve Norlander regular RN then by [facility] RN on 12/07/13 Debbie notified Orders entered in EPIC Pt agreed  ED Comments  Detail:

## 2013-11-25 NOTE — ED Notes (Signed)
TTS at bedside. 

## 2013-11-25 NOTE — Consult Note (Signed)
  Psychiatric Specialty Exam: Physical Exam  ROS  Blood pressure 125/69, pulse 73, temperature 98.4 F (36.9 C), temperature source Oral, resp. rate 18, height 5\' 3"  (1.6 m), weight 67.586 kg (149 lb), SpO2 93.00%.Body mass index is 26.4 kg/(m^2).  General Appearance: Casual  Eye Contact::  Good  Speech:  Clear and Coherent  Volume:  Normal  Mood:  Anxious  Affect:  Appropriate  Thought Process:  Goal Directed and Logical  Orientation:  Full (Time, Place, and Person)  Thought Content:  Negative  Suicidal Thoughts:  Yes.  without intent/plan  Homicidal Thoughts:  No  Memory:  Immediate;   Good Recent;   Good Remote;   Good  Judgement:  Intact  Insight:  Fair  Psychomotor Activity:  Normal  Concentration:  Good  Recall:  Good  Akathisia:  Negative  Handed:  Right  AIMS (if indicated):     Assets:  Communication Skills Desire for Improvement Financial Resources/Insurance Housing  Sleep:   good  Lori Cooke is less anxious but still cannot contract for safety if discharged.  However, she believes she might be able to go home to [place] tomorrow if her progress continues.  She has been talking to her friend, Lori Cooke, and her daughter who have given her hope that there may be a way to get her back to [facility] which is home for her.  She says she has COPD and that is why she needs oxygen prn.  If an inpatient bed is available, she may be transferred; if not she may be ready for discharge home tomorrow.

## 2013-11-25 NOTE — Progress Notes (Signed)
CSW consulted with NP Rankin who asked CSW to follow up on pts discharge transportation plans.  CSW contacted TTS to see if pts friend had been contacted.  Per Tom @ [facility], transportation plans had not been completed.  CSW contacted, pts friend, Lori Cooke @ [phone] to arrange transportation.  Ms. Loleta Chance reported that she will start on her way to pick client up now.  Lori Cooke, Lori Cooke  [phone] .11/25/2013  4:45 pm

## 2013-11-25 NOTE — Progress Notes (Addendum)
CSW met with pt at bedside. Pt denies SI/HI/AH/VH. Patient states she feels she can try to go home and is able to contract for safety at this time. Pt has no family support. Pt has support from [facility] who is a friend, and also is a Merchandiser, retail for [employer], where pt aid is employeed. Pt also recieves [facility]. Pt friend suggested another [facility] agency with a Set designer. CSW will discuss with rn cm. Pt states she has a follow up appointment with a doctor on 12/01/2013 that was set up in [place], however not sure of the name. CSW contacted Claris Che at [facility] who is looking up patient discharge information. CSW awaiting return call.    Frutoso Schatz [phone]  ED CSW 11/25/2013 1432pm   CSW spoke with [facility], who states pt discharge plan was to follow up with [facility] which have walk in hours.   TTS to follow up with NP. RN CM coordinating HH for psych rn.  Once NP has documented, patient can be discharged. Please call pt friend Almyra Deforest.  Catha Gosselin, LCSW [phone]  ED CSW .11/25/2013 1432pm

## 2013-11-25 NOTE — ED Notes (Signed)
Pt. Was discharged to home. Writer went over AVS and medications with patient. Patient verbalized understanding. No SI/HI or A/V hallucinations upon discharge. Patient did not report pain upon discharge. Patient had no questions or concerns. Patient's belongings were returned to patient. Writer retrieved patient's home med from pharmacy and gave back to patient. No distress noted upon discharge. Q15 minute safety checks were maintained until patient was discharged.

## 2013-11-25 NOTE — ED Notes (Signed)
Writer accidentally got another patient to sign discharge signature in this patient's chart. This patient has NOT been discharged and did NOT sign the e-signature. That was a documentation mistake.

## 2013-11-25 NOTE — Progress Notes (Signed)
CSW spoke with Agustin Cree at [place] who is reviewing patient.   CSW awaiting to speak further with [facility] at [facility] regarding patient oxygen. Per Nurse, patient maintaining at 92% without oxygen, and only needed oxygen PRN at night.  CSW left message at [facility] to reconsider patient, patient information refaxed.    Pt declined at [facility] and [facility], due to oxygen.

## 2013-11-25 NOTE — Consult Note (Signed)
  Subjective: Patient states that at this time she is able to contract for safety and go home today instead of waiting until tomorrow morning.  Patient denies suicidal/homicidal ideation, psychosis, and paranoia.  SW has spoken to [employer] who is a friend, and also is a Merchandiser, retail for [employer].    Disposition:  Patient can be discharge home to follow up with primary provider.     Shuvon B. Rankin FNP-BC Family Nurse Practitioner, Board Certified

## 2013-12-01 NOTE — Consult Note (Signed)
Note reviewed and agreed with  

## 2013-12-07 ENCOUNTER — Inpatient Hospital Stay: Payer: Self-pay | Admitting: Internal Medicine

## 2013-12-07 LAB — COMPREHENSIVE METABOLIC PANEL
Alkaline Phosphatase: 202 U/L — ABNORMAL HIGH
Anion Gap: 5 — ABNORMAL LOW (ref 7–16)
Bilirubin,Total: 0.3 mg/dL (ref 0.2–1.0)
Calcium, Total: 9.2 mg/dL (ref 8.5–10.1)
Chloride: 109 mmol/L — ABNORMAL HIGH (ref 98–107)
Co2: 27 mmol/L (ref 21–32)
Creatinine: 0.86 mg/dL (ref 0.60–1.30)
EGFR (African American): >60
EGFR (Non-African Amer.): >60
Osmolality: 281 (ref 275–301)
SGOT(AST): 22 U/L (ref 15–37)
SGPT (ALT): 21 U/L (ref 12–78)
Sodium: 141 mmol/L (ref 136–145)
Total Protein: 7.3 g/dL (ref 6.4–8.2)

## 2013-12-07 LAB — CBC
MCV: 90 fL (ref 80–100)
Platelet: 211 10*3/uL (ref 150–440)
RDW: 13.4 % (ref 11.5–14.5)

## 2013-12-07 LAB — TROPONIN I: Troponin-I: <0.02 ng/mL

## 2013-12-08 LAB — CBC WITH DIFFERENTIAL/PLATELET
Basophil #: 0 10*3/uL (ref 0.0–0.1)
Eosinophil %: 0 %
HCT: 38.5 % (ref 35.0–47.0)
Lymphocyte #: 0.7 10*3/uL — ABNORMAL LOW (ref 1.0–3.6)
MCH: 31.3 pg (ref 26.0–34.0)
MCHC: 34.4 g/dL (ref 32.0–36.0)
MCV: 91 fL (ref 80–100)
Monocyte #: 0.1 x10 3/mm — ABNORMAL LOW (ref 0.2–0.9)
Neutrophil %: 88.1 %
Platelet: 213 10*3/uL (ref 150–440)
WBC: 7.4 10*3/uL (ref 3.6–11.0)

## 2013-12-08 LAB — BASIC METABOLIC PANEL
Anion Gap: 8 (ref 7–16)
Calcium, Total: 9.6 mg/dL (ref 8.5–10.1)
Creatinine: 0.94 mg/dL (ref 0.60–1.30)
EGFR (African American): >60
EGFR (Non-African Amer.): >60
Glucose: 204 mg/dL — ABNORMAL HIGH (ref 65–99)
Sodium: 138 mmol/L (ref 136–145)

## 2013-12-08 LAB — MAGNESIUM: Magnesium: 1.5 mg/dL — ABNORMAL LOW

## 2013-12-09 LAB — MAGNESIUM: Magnesium: 2 mg/dL

## 2013-12-09 LAB — BUN: BUN: 22 mg/dL — ABNORMAL HIGH (ref 7–18)

## 2013-12-12 LAB — CBC WITH DIFFERENTIAL/PLATELET
Basophil %: 0.2 %
Eosinophil #: 0 10*3/uL (ref 0.0–0.7)
MCH: 30.9 pg (ref 26.0–34.0)
MCHC: 33.7 g/dL (ref 32.0–36.0)
MCV: 92 fL (ref 80–100)
Neutrophil #: 9.1 10*3/uL — ABNORMAL HIGH (ref 1.4–6.5)
Platelet: 179 10*3/uL (ref 150–440)
RBC: 3.73 10*6/uL — ABNORMAL LOW (ref 3.80–5.20)
WBC: 10.2 10*3/uL (ref 3.6–11.0)

## 2013-12-12 LAB — BASIC METABOLIC PANEL
Anion Gap: 3 — ABNORMAL LOW (ref 7–16)
BUN: 28 mg/dL — ABNORMAL HIGH (ref 7–18)
Chloride: 99 mmol/L (ref 98–107)
Co2: 32 mmol/L (ref 21–32)
EGFR (African American): >60
EGFR (Non-African Amer.): >60
Sodium: 134 mmol/L — ABNORMAL LOW (ref 136–145)

## 2013-12-12 LAB — PRO B NATRIURETIC PEPTIDE: B-Type Natriuretic Peptide: 2123 pg/mL — ABNORMAL HIGH

## 2013-12-13 LAB — BASIC METABOLIC PANEL
Anion Gap: 5 — ABNORMAL LOW (ref 7–16)
BUN: 29 mg/dL — ABNORMAL HIGH (ref 7–18)
Chloride: 96 mmol/L — ABNORMAL LOW (ref 98–107)
Creatinine: 0.99 mg/dL (ref 0.60–1.30)
EGFR (African American): >60
EGFR (Non-African Amer.): >60
Glucose: 446 mg/dL — ABNORMAL HIGH (ref 65–99)
Osmolality: 288 (ref 275–301)

## 2013-12-13 LAB — HEMOGLOBIN: HGB: 12.3 g/dL (ref 12.0–16.0)

## 2013-12-14 LAB — BASIC METABOLIC PANEL
Anion Gap: 2 — ABNORMAL LOW (ref 7–16)
BUN: 28 mg/dL — ABNORMAL HIGH (ref 7–18)
Calcium, Total: 9.1 mg/dL (ref 8.5–10.1)
Chloride: 92 mmol/L — ABNORMAL LOW (ref 98–107)
Co2: 36 mmol/L — ABNORMAL HIGH (ref 21–32)
Creatinine: 1.02 mg/dL (ref 0.60–1.30)
Sodium: 130 mmol/L — ABNORMAL LOW (ref 136–145)

## 2013-12-25 ENCOUNTER — Other Ambulatory Visit (HOSPITAL_COMMUNITY): Payer: Self-pay | Admitting: Psychiatry

## 2014-01-05 ENCOUNTER — Inpatient Hospital Stay: Payer: Self-pay | Admitting: Internal Medicine

## 2014-01-05 LAB — CBC
HCT: 33.5 % — ABNORMAL LOW (ref 35.0–47.0)
HGB: 11.6 g/dL — ABNORMAL LOW (ref 12.0–16.0)
MCH: 29.9 pg (ref 26.0–34.0)
MCHC: 34.6 g/dL (ref 32.0–36.0)
MCV: 87 fL (ref 80–100)
PLATELETS: 166 10*3/uL (ref 150–440)
RBC: 3.87 10*6/uL (ref 3.80–5.20)
RDW: 14.2 % (ref 11.5–14.5)
WBC: 15 10*3/uL — AB (ref 3.6–11.0)

## 2014-01-05 LAB — BASIC METABOLIC PANEL
Anion Gap: 7 (ref 7–16)
BUN: 10 mg/dL (ref 7–18)
CHLORIDE: 96 mmol/L — AB (ref 98–107)
CREATININE: 1.06 mg/dL (ref 0.60–1.30)
Calcium, Total: 8.7 mg/dL (ref 8.5–10.1)
Co2: 32 mmol/L (ref 21–32)
EGFR (African American): >60
EGFR (Non-African Amer.): 56 — ABNORMAL LOW
Glucose: 134 mg/dL — ABNORMAL HIGH (ref 65–99)
OSMOLALITY: 271 (ref 275–301)
POTASSIUM: 2.3 mmol/L — AB (ref 3.5–5.1)
SODIUM: 135 mmol/L — AB (ref 136–145)

## 2014-01-05 LAB — CK-MB: CK-MB: 1.6 ng/mL (ref 0.5–3.6)

## 2014-01-05 LAB — CBC WITH DIFFERENTIAL/PLATELET
Basophil #: 0 10*3/uL (ref 0.0–0.1)
Basophil %: 0.2 %
Eosinophil #: 0 10*3/uL (ref 0.0–0.7)
Eosinophil %: 0.1 %
HCT: 33.7 % — ABNORMAL LOW (ref 35.0–47.0)
HGB: 11.6 g/dL — ABNORMAL LOW (ref 12.0–16.0)
Lymphocyte #: 0.8 10*3/uL — ABNORMAL LOW (ref 1.0–3.6)
Lymphocyte %: 5.3 %
MCH: 29.7 pg (ref 26.0–34.0)
MCHC: 34.4 g/dL (ref 32.0–36.0)
MCV: 87 fL (ref 80–100)
MONO ABS: 0.4 x10 3/mm (ref 0.2–0.9)
MONOS PCT: 2.8 %
Neutrophil #: 13.9 10*3/uL — ABNORMAL HIGH (ref 1.4–6.5)
Neutrophil %: 91.6 %
Platelet: 163 10*3/uL (ref 150–440)
RBC: 3.9 10*6/uL (ref 3.80–5.20)
RDW: 14.3 % (ref 11.5–14.5)
WBC: 15.2 10*3/uL — ABNORMAL HIGH (ref 3.6–11.0)

## 2014-01-05 LAB — APTT: Activated PTT: 40.9 secs — ABNORMAL HIGH (ref 23.6–35.9)

## 2014-01-05 LAB — PRO B NATRIURETIC PEPTIDE: B-TYPE NATIURETIC PEPTID: 9554 pg/mL — AB (ref 0–125)

## 2014-01-05 LAB — TROPONIN I
TROPONIN-I: 0.32 ng/mL — AB
Troponin-I: 0.27 ng/mL — ABNORMAL HIGH

## 2014-01-06 LAB — APTT
ACTIVATED PTT: 54.3 s — AB (ref 23.6–35.9)
Activated PTT: 57.9 secs — ABNORMAL HIGH (ref 23.6–35.9)
Activated PTT: 64.9 secs — ABNORMAL HIGH (ref 23.6–35.9)

## 2014-01-06 LAB — CK-MB
CK-MB: 1.6 ng/mL (ref 0.5–3.6)
CK-MB: 1.9 ng/mL (ref 0.5–3.6)

## 2014-01-06 LAB — TROPONIN I: Troponin-I: 0.26 ng/mL — ABNORMAL HIGH

## 2014-01-06 LAB — CK TOTAL AND CKMB (NOT AT ARMC)
CK, TOTAL: 23 U/L (ref 21–215)
CK, Total: 29 U/L (ref 21–215)

## 2014-01-07 LAB — CBC WITH DIFFERENTIAL/PLATELET
BASOS ABS: 0 10*3/uL (ref 0.0–0.1)
BASOS ABS: 0 10*3/uL (ref 0.0–0.1)
BASOS PCT: 0.1 %
Basophil %: 0.2 %
EOS ABS: 0 10*3/uL (ref 0.0–0.7)
EOS PCT: 0 %
Eosinophil #: 0 10*3/uL (ref 0.0–0.7)
Eosinophil %: 0 %
HCT: 22.9 % — AB (ref 35.0–47.0)
HCT: 22.1 % — AB (ref 35.0–47.0)
HGB: 7.7 g/dL — ABNORMAL LOW (ref 12.0–16.0)
LYMPHS ABS: 1.1 10*3/uL (ref 1.0–3.6)
Lymphocyte #: 0.6 10*3/uL — ABNORMAL LOW (ref 1.0–3.6)
Lymphocyte %: 10.1 %
Lymphocyte %: 5.6 %
MCH: 30.6 pg (ref 26.0–34.0)
MCH: 30.5 pg (ref 26.0–34.0)
MCHC: 35.5 g/dL (ref 32.0–36.0)
MCHC: 34.8 g/dL (ref 32.0–36.0)
MCV: 86 fL (ref 80–100)
MCV: 88 fL (ref 80–100)
MONO ABS: 0.5 x10 3/mm (ref 0.2–0.9)
MONOS PCT: 2.4 %
Monocyte #: 0.3 x10 3/mm (ref 0.2–0.9)
Monocyte %: 4.8 %
NEUTROS ABS: 8.9 10*3/uL — AB (ref 1.4–6.5)
Neutrophil #: 9.7 10*3/uL — ABNORMAL HIGH (ref 1.4–6.5)
Neutrophil %: 85 %
Neutrophil %: 91.8 %
Platelet: 101 10*3/uL — ABNORMAL LOW (ref 150–440)
RBC: 2.65 10*6/uL — ABNORMAL LOW (ref 3.80–5.20)
RBC: 2.52 10*6/uL — ABNORMAL LOW (ref 3.80–5.20)
RDW: 14.1 % (ref 11.5–14.5)
RDW: 14.1 % (ref 11.5–14.5)
WBC: 10.4 10*3/uL (ref 3.6–11.0)
WBC: 10.6 10*3/uL (ref 3.6–11.0)

## 2014-01-07 LAB — BASIC METABOLIC PANEL
Anion Gap: 3 — ABNORMAL LOW (ref 7–16)
BUN: 25 mg/dL — ABNORMAL HIGH (ref 7–18)
Calcium, Total: 8.2 mg/dL — ABNORMAL LOW (ref 8.5–10.1)
Chloride: 98 mmol/L (ref 98–107)
Co2: 31 mmol/L (ref 21–32)
Creatinine: 1.15 mg/dL (ref 0.60–1.30)
EGFR (African American): 59 — ABNORMAL LOW
EGFR (Non-African Amer.): 51 — ABNORMAL LOW
Glucose: 123 mg/dL — ABNORMAL HIGH (ref 65–99)
Osmolality: 270 (ref 275–301)
POTASSIUM: 3.7 mmol/L (ref 3.5–5.1)
SODIUM: 132 mmol/L — AB (ref 136–145)

## 2014-01-07 LAB — PLATELET COUNT: Platelet: 90 10*3/uL — ABNORMAL LOW (ref 150–440)

## 2014-01-07 LAB — APTT: Activated PTT: 95.8 secs — ABNORMAL HIGH (ref 23.6–35.9)

## 2014-01-07 LAB — HEMOGLOBIN
HGB: 8.1 g/dL — ABNORMAL LOW (ref 12.0–16.0)
HGB: 7.6 g/dL — ABNORMAL LOW (ref 12.0–16.0)

## 2014-01-07 LAB — LACTATE DEHYDROGENASE: LDH: 258 U/L — ABNORMAL HIGH (ref 81–246)

## 2014-01-08 LAB — CBC WITH DIFFERENTIAL/PLATELET
Basophil #: 0 10*3/uL (ref 0.0–0.1)
Basophil %: 0.2 %
Eosinophil #: 0 10*3/uL (ref 0.0–0.7)
Eosinophil %: 0 %
HCT: 23.8 % — ABNORMAL LOW (ref 35.0–47.0)
HGB: 8.1 g/dL — AB (ref 12.0–16.0)
LYMPHS PCT: 9.5 %
Lymphocyte #: 0.9 10*3/uL — ABNORMAL LOW (ref 1.0–3.6)
MCH: 29.2 pg (ref 26.0–34.0)
MCHC: 34.1 g/dL (ref 32.0–36.0)
MCV: 85 fL (ref 80–100)
Monocyte #: 0.4 x10 3/mm (ref 0.2–0.9)
Monocyte %: 3.6 %
NEUTROS PCT: 86.7 %
Neutrophil #: 8.5 10*3/uL — ABNORMAL HIGH (ref 1.4–6.5)
PLATELETS: 95 10*3/uL — AB (ref 150–440)
RBC: 2.78 10*6/uL — ABNORMAL LOW (ref 3.80–5.20)
RDW: 15 % — AB (ref 11.5–14.5)
WBC: 9.8 10*3/uL (ref 3.6–11.0)

## 2014-01-08 LAB — BASIC METABOLIC PANEL
Anion Gap: 5 — ABNORMAL LOW (ref 7–16)
BUN: 29 mg/dL — ABNORMAL HIGH (ref 7–18)
CALCIUM: 8 mg/dL — AB (ref 8.5–10.1)
Chloride: 98 mmol/L (ref 98–107)
Co2: 30 mmol/L (ref 21–32)
Glucose: 150 mg/dL — ABNORMAL HIGH (ref 65–99)
Osmolality: 275 (ref 275–301)
Potassium: 4.9 mmol/L (ref 3.5–5.1)
Sodium: 133 mmol/L — ABNORMAL LOW (ref 136–145)

## 2014-01-08 LAB — IRON AND TIBC
Iron Bind.Cap.(Total): 279 ug/dL (ref 250–450)
Iron Saturation: 84 %
Iron: 233 ug/dL — ABNORMAL HIGH (ref 50–170)
Unbound Iron-Bind.Cap.: 46 ug/dL

## 2014-01-08 LAB — CREATININE, SERUM
CREATININE: 1.21 mg/dL (ref 0.60–1.30)
EGFR (African American): 56 — ABNORMAL LOW
GFR CALC NON AF AMER: 48 — AB

## 2014-01-08 LAB — CULTURE, BLOOD (SINGLE)

## 2014-01-08 LAB — HEMOGLOBIN: HGB: 8.7 g/dL — ABNORMAL LOW (ref 12.0–16.0)

## 2014-01-08 LAB — FERRITIN: Ferritin (ARMC): 155 ng/mL (ref 8–388)

## 2014-01-09 LAB — CBC WITH DIFFERENTIAL/PLATELET
BANDS NEUTROPHIL: 3 %
Comment - H1-Com2: NORMAL
HCT: 25.2 % — ABNORMAL LOW (ref 35.0–47.0)
HGB: 8.5 g/dL — AB (ref 12.0–16.0)
Lymphocytes: 16 %
MCH: 28.9 pg (ref 26.0–34.0)
MCHC: 33.6 g/dL (ref 32.0–36.0)
MCV: 86 fL (ref 80–100)
MONOS PCT: 7 %
Metamyelocyte: 1 %
Platelet: 121 10*3/uL — ABNORMAL LOW (ref 150–440)
RBC: 2.93 10*6/uL — AB (ref 3.80–5.20)
RDW: 14.5 % (ref 11.5–14.5)
Segmented Neutrophils: 73 %
WBC: 13.5 10*3/uL — AB (ref 3.6–11.0)

## 2014-01-09 LAB — VANCOMYCIN, TROUGH: Vancomycin, Trough: 13 ug/mL (ref 10–20)

## 2014-01-09 LAB — RETICULOCYTES
Absolute Retic Count: 0.041 10*6/uL (ref 0.019–0.186)
RETICULOCYTE: 1.4 % (ref 0.4–3.1)

## 2014-01-11 LAB — URINE IEP, RANDOM

## 2014-01-12 LAB — BASIC METABOLIC PANEL
Anion Gap: 2 — ABNORMAL LOW (ref 7–16)
BUN: 18 mg/dL (ref 7–18)
CHLORIDE: 102 mmol/L (ref 98–107)
CREATININE: 0.97 mg/dL (ref 0.60–1.30)
Calcium, Total: 8.6 mg/dL (ref 8.5–10.1)
Co2: 30 mmol/L (ref 21–32)
EGFR (Non-African Amer.): >60
GLUCOSE: 90 mg/dL (ref 65–99)
OSMOLALITY: 270 (ref 275–301)
Potassium: 4.4 mmol/L (ref 3.5–5.1)
SODIUM: 134 mmol/L — AB (ref 136–145)

## 2014-01-12 LAB — CBC WITH DIFFERENTIAL/PLATELET
BASOS PCT: 0.2 %
Basophil #: 0 10*3/uL (ref 0.0–0.1)
Eosinophil #: 0 10*3/uL (ref 0.0–0.7)
Eosinophil %: 0 %
HCT: 24.3 % — AB (ref 35.0–47.0)
HGB: 8.1 g/dL — ABNORMAL LOW (ref 12.0–16.0)
LYMPHS ABS: 2.3 10*3/uL (ref 1.0–3.6)
LYMPHS PCT: 12 %
MCH: 29.2 pg (ref 26.0–34.0)
MCHC: 33.5 g/dL (ref 32.0–36.0)
MCV: 87 fL (ref 80–100)
MONOS PCT: 3.8 %
Monocyte #: 0.7 x10 3/mm (ref 0.2–0.9)
Neutrophil #: 16 10*3/uL — ABNORMAL HIGH (ref 1.4–6.5)
Neutrophil %: 84 %
Platelet: 238 10*3/uL (ref 150–440)
RBC: 2.79 10*6/uL — AB (ref 3.80–5.20)
RDW: 14.2 % (ref 11.5–14.5)
WBC: 19 10*3/uL — AB (ref 3.6–11.0)

## 2014-01-12 LAB — VANCOMYCIN, TROUGH: Vancomycin, Trough: 17 ug/mL (ref 10–20)

## 2014-01-12 LAB — PROT IMMUNOELECTROPHORES(ARMC)

## 2014-01-13 LAB — CBC WITH DIFFERENTIAL/PLATELET
HCT: 24.4 % — ABNORMAL LOW (ref 35.0–47.0)
HGB: 8.2 g/dL — ABNORMAL LOW (ref 12.0–16.0)
LYMPHS PCT: 11 %
MCH: 29.4 pg (ref 26.0–34.0)
MCHC: 33.7 g/dL (ref 32.0–36.0)
MCV: 87 fL (ref 80–100)
MONOS PCT: 2 %
Myelocyte: 1 %
PLATELETS: 289 10*3/uL (ref 150–440)
RBC: 2.79 10*6/uL — AB (ref 3.80–5.20)
RDW: 14.5 % (ref 11.5–14.5)
SEGMENTED NEUTROPHILS: 86 %
WBC: 17.6 10*3/uL — AB (ref 3.6–11.0)

## 2014-01-14 LAB — CBC WITH DIFFERENTIAL/PLATELET
HCT: 23.7 % — AB (ref 35.0–47.0)
HGB: 8 g/dL — AB (ref 12.0–16.0)
LYMPHS PCT: 14 %
MCH: 29.6 pg (ref 26.0–34.0)
MCHC: 33.6 g/dL (ref 32.0–36.0)
MCV: 88 fL (ref 80–100)
MONOS PCT: 4 %
Metamyelocyte: 1 %
Myelocyte: 1 %
Platelet: 317 10*3/uL (ref 150–440)
RBC: 2.7 10*6/uL — AB (ref 3.80–5.20)
RDW: 14.7 % — AB (ref 11.5–14.5)
Segmented Neutrophils: 80 %
WBC: 14 10*3/uL — ABNORMAL HIGH (ref 3.6–11.0)

## 2014-01-14 LAB — CREATININE, SERUM
Creatinine: 1.07 mg/dL (ref 0.60–1.30)
GFR CALC NON AF AMER: 56 — AB

## 2014-01-15 LAB — CBC WITH DIFFERENTIAL/PLATELET
Basophil #: 0.1 x10 3/mm 3
Basophil %: 1 %
Eosinophil #: 0.1 x10 3/mm 3
Eosinophil %: 0.4 %
HCT: 24.4 % — ABNORMAL LOW
HGB: 8.1 g/dL — ABNORMAL LOW
Lymphocyte %: 14.7 %
Lymphs Abs: 2.3 x10 3/mm 3
MCH: 29.3 pg
MCHC: 33.1 g/dL
MCV: 89 fL
Monocyte #: 0.8 "x10 3/mm "
Monocyte %: 5.4 %
Neutrophil #: 12.2 x10 3/mm 3 — ABNORMAL HIGH
Neutrophil %: 78.5 %
Platelet: 340 x10 3/mm 3
RBC: 2.76 X10 6/mm 3 — ABNORMAL LOW
RDW: 15.6 % — ABNORMAL HIGH
WBC: 15.5 x10 3/mm 3 — ABNORMAL HIGH

## 2014-01-15 LAB — CREATININE, SERUM
Creatinine: 0.97 mg/dL
EGFR (African American): >60
EGFR (Non-African Amer.): >60

## 2014-01-15 LAB — CULTURE, BLOOD (SINGLE)

## 2014-01-17 LAB — CULTURE, BLOOD (SINGLE)

## 2014-06-22 NOTE — Progress Notes (Signed)
06/22/14 1340 ED CM Received a call from [phone] Jan [facility] stating need for Dr Anitra LauthPlunkett to provided assistance with 11/2013 home health orders. Cm redirected her to pcp Wonda ChengJoel Moffett at [phone] or [address] [place], [place] [address] [phone] Dr Anitra LauthPlunkett not available to assist at this time

## 2015-02-11 IMAGING — CR DG CHEST 1V PORT
1 series · 1 of 1 positions shown · non-contrast
Comparison: DG CHEST 2V dated 12/13/2013;

CLINICAL DATA: Shortness of breath.  Chest pain.  Fall.

EXAM:
PORTABLE CHEST - 1 VIEW

[ap]
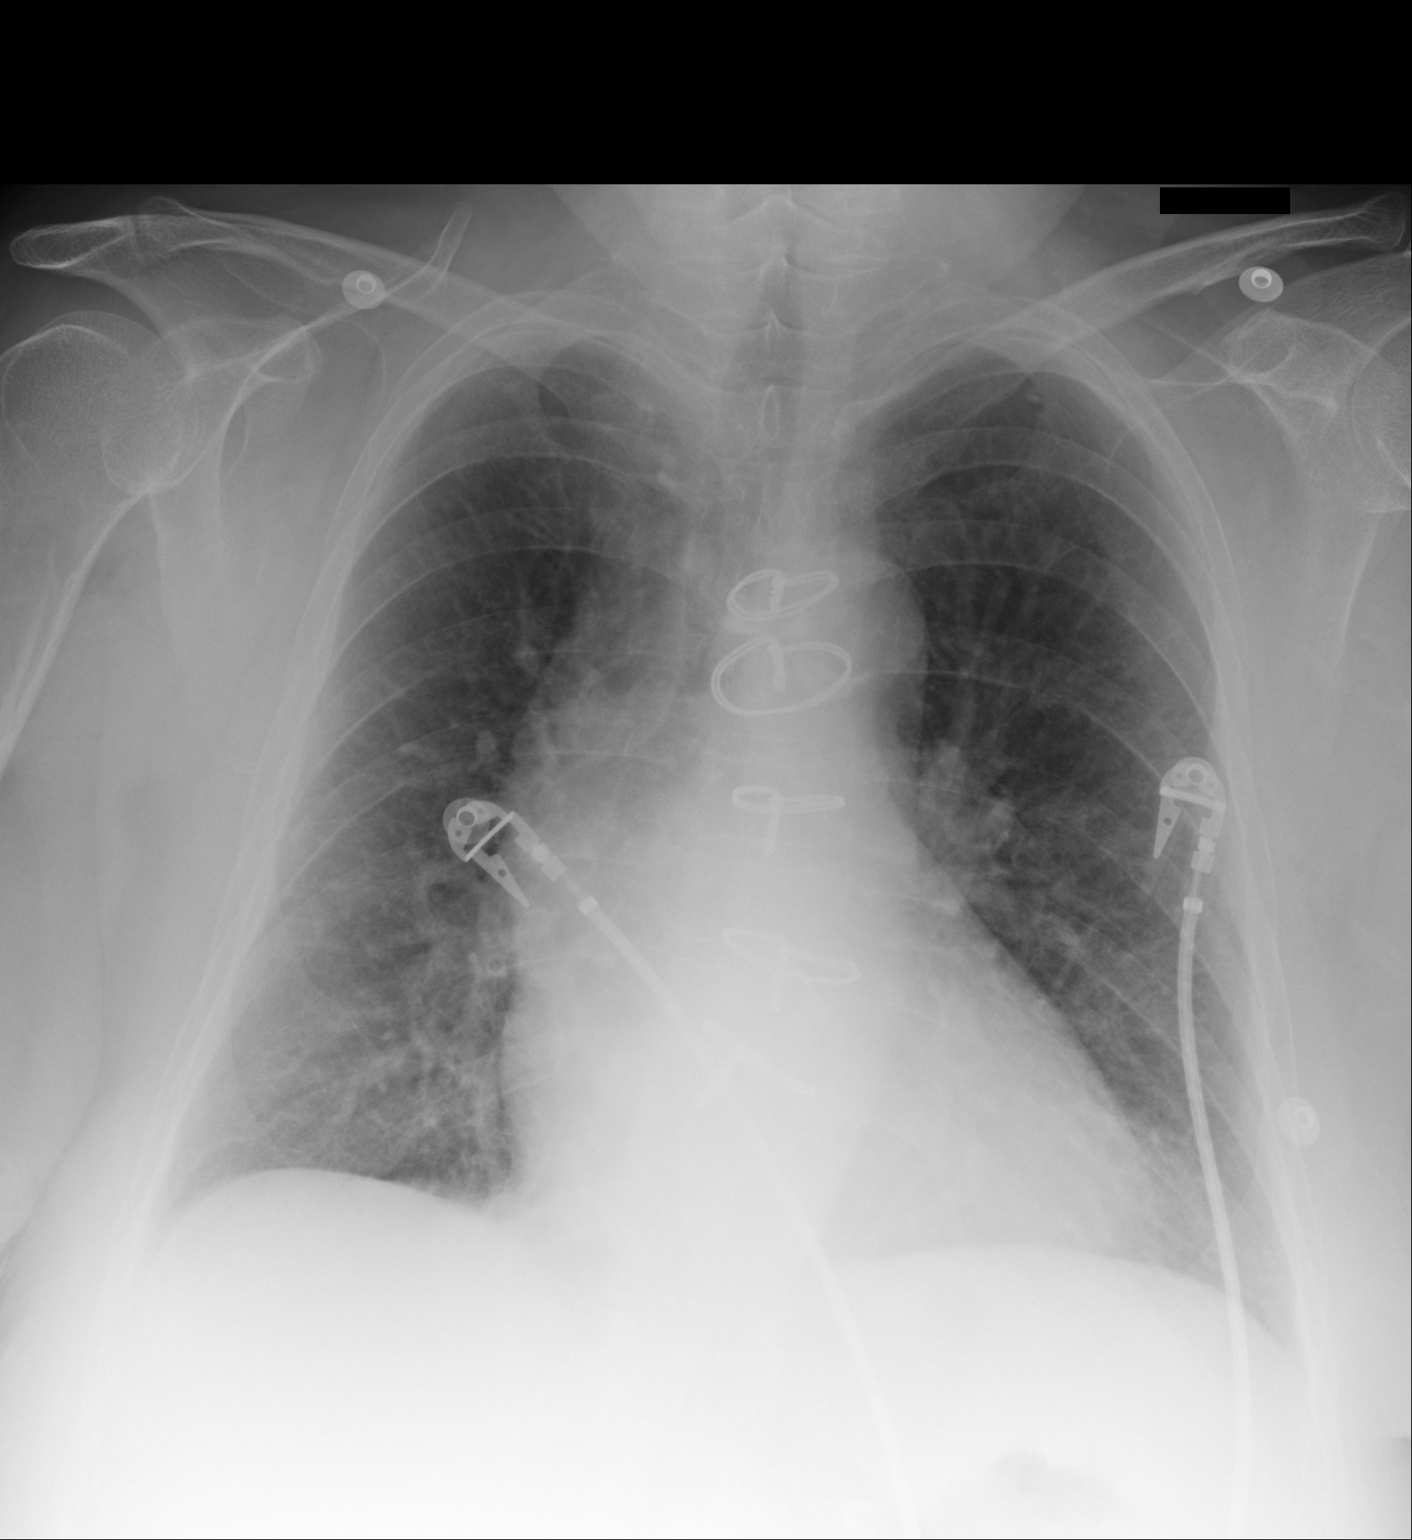

[1 of 1 positions shown; findings below may reference images not displayed]

DG CHEST 2V dated
12/11/2013; DG CHEST 2V dated 12/07/2013; CT CHEST W/ CM dated
11/22/2008
FINDINGS: Mild cardiomegaly with cardiothoracic index of 58%. Mildly tortuous
thoracic aorta. Prior median sternotomy.

The lungs appear clear. No pneumothorax. No pleural effusion
observed. Prosthetic aortic valve. Ectatic ascending thoracic aorta.
IMPRESSION: 1. Mildly enlarged cardiopericardial silhouette.
2. Prosthetic aortic valve with ectatic ascending thoracic aorta.

## 2015-02-13 IMAGING — CT CT ABD-PELV W/O CM
2 of 4 series · 16 of 46 positions shown, 18 images · non-contrast
Comparison: None.

CLINICAL DATA: On set of lower abdominal discomfort today, drop in
hemoglobin level

EXAM:
CT ABDOMEN AND PELVIS WITHOUT CONTRAST
TECHNIQUE: Multidetector CT imaging of the abdomen and pelvis was performed
following the standard protocol without intravenous contrast.

[Series 2: routine abd pel without · axial · non-contrast · 0.75mm/px · z∈[-828,-398]mm · 13 of 94 slices shown, 15 images]
[im 4/94  soft-tissue]
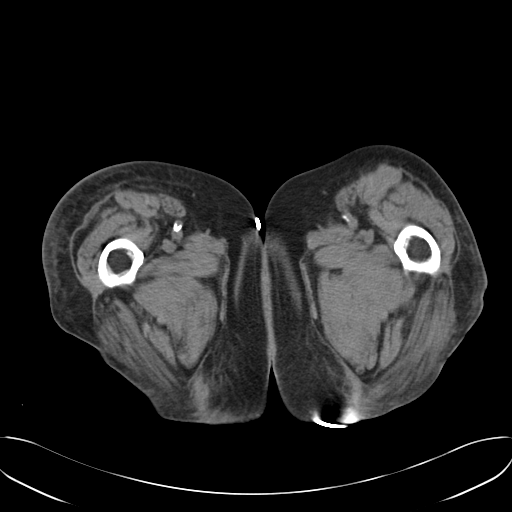
[im 4/94  bone]
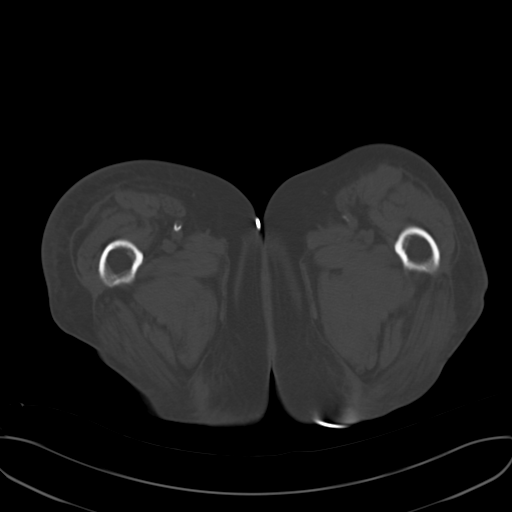
[im 12/94  soft-tissue]
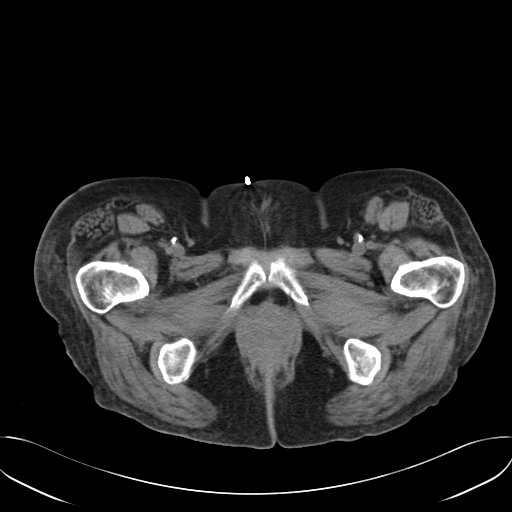
[im 19/94  soft-tissue]
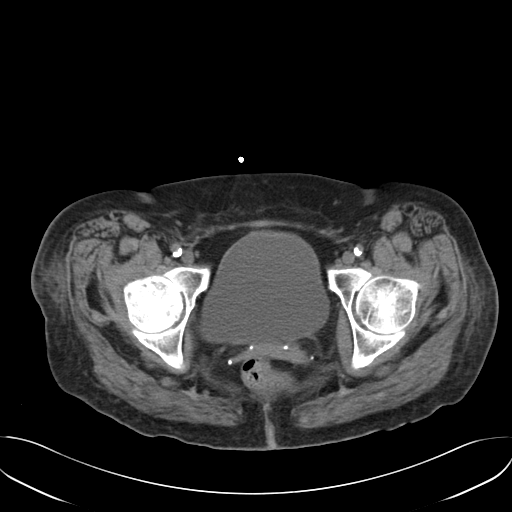
[im 27/94  soft-tissue]
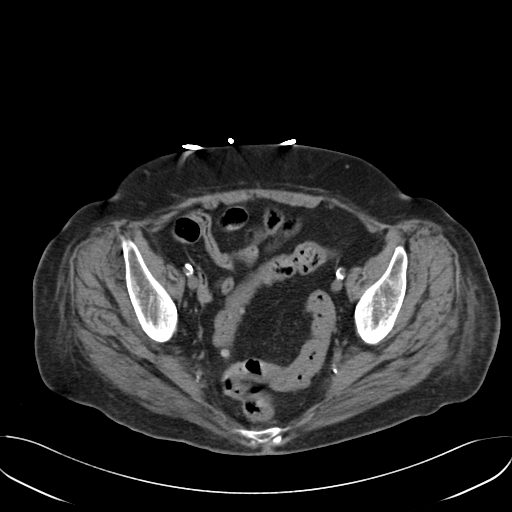
[im 34/94  soft-tissue]
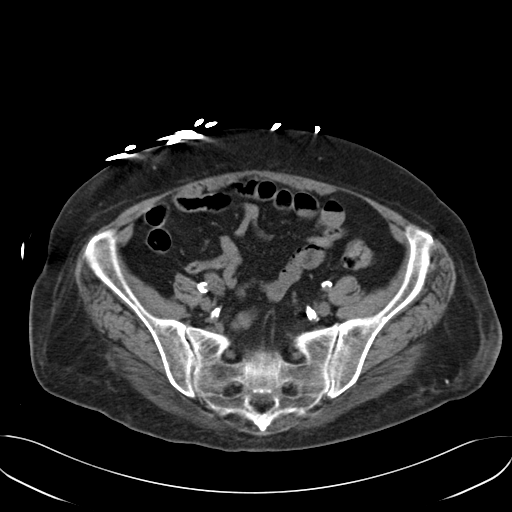
[im 41/94  soft-tissue]
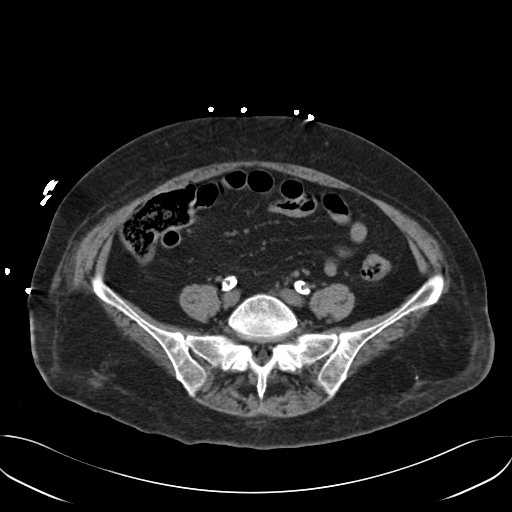
[im 49/94  soft-tissue]
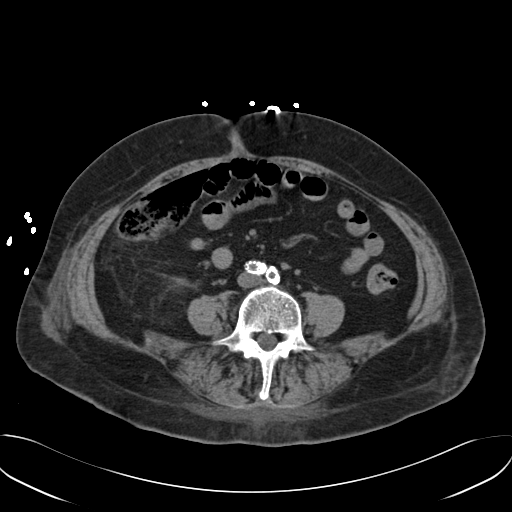
[im 53/94  soft-tissue]
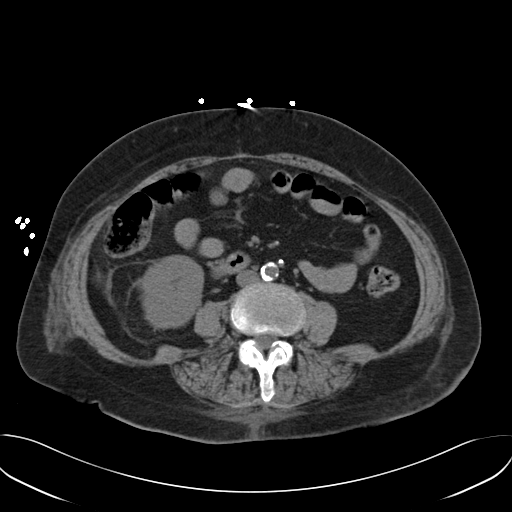
[im 60/94  soft-tissue]
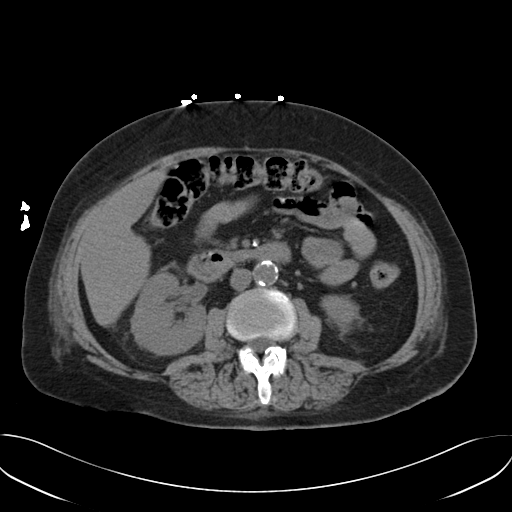
[im 60/94  bone]
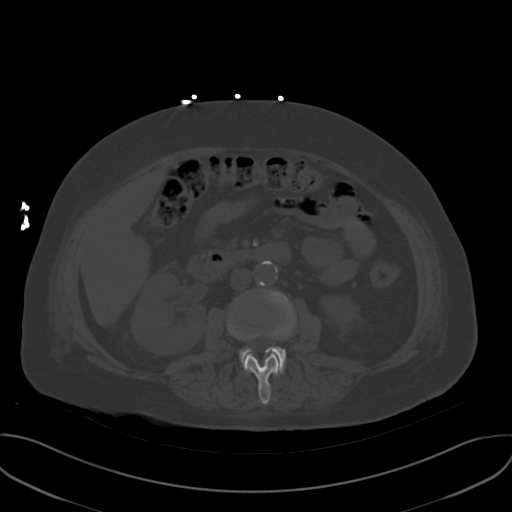
[im 67/94  soft-tissue]
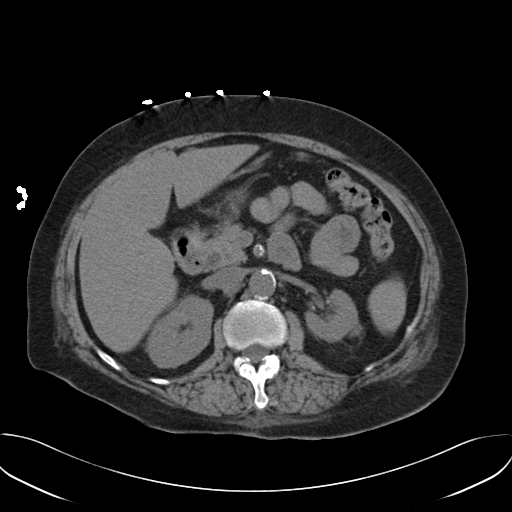
[im 75/94  soft-tissue]
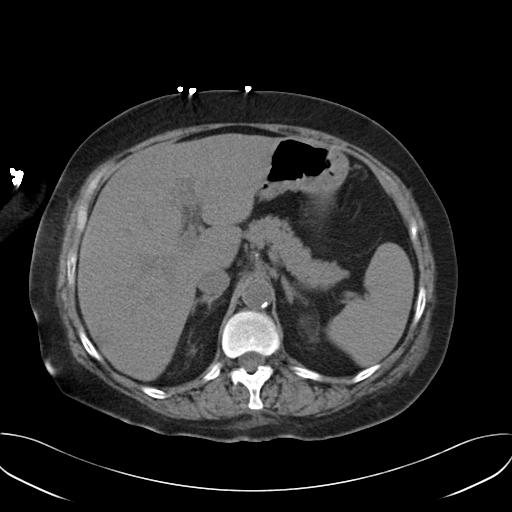
[im 82/94  soft-tissue]
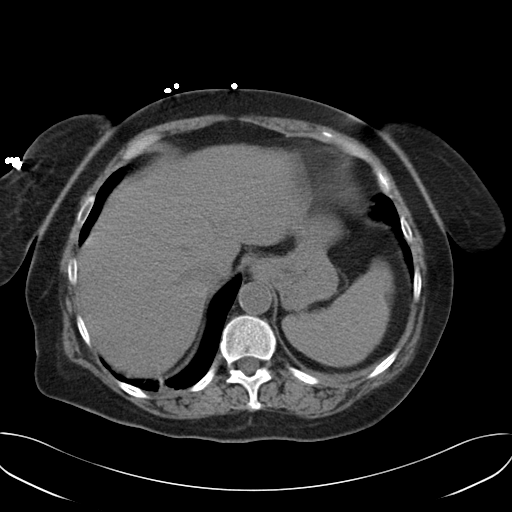
[im 90/94  soft-tissue]
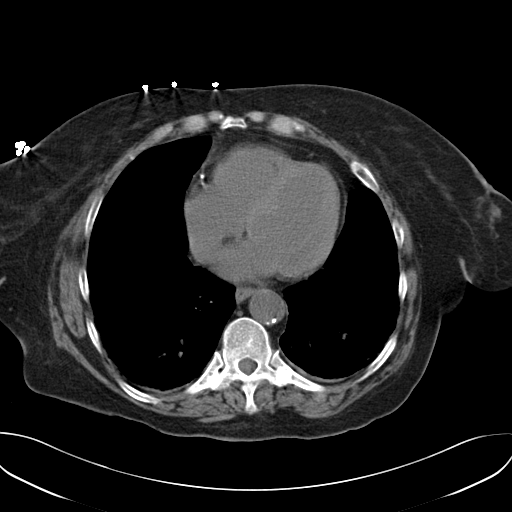

[Series 5: cor routine abd pel wo · coronal · 0.89mm/px · 3 of 137 slices shown]
[im 46/137  soft-tissue]
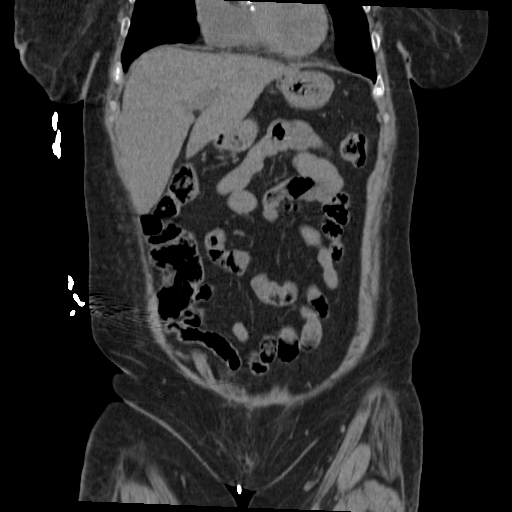
[im 61/137  soft-tissue]
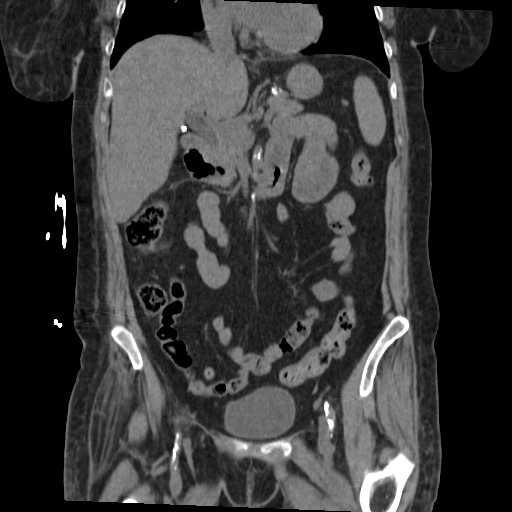
[im 76/137  soft-tissue]
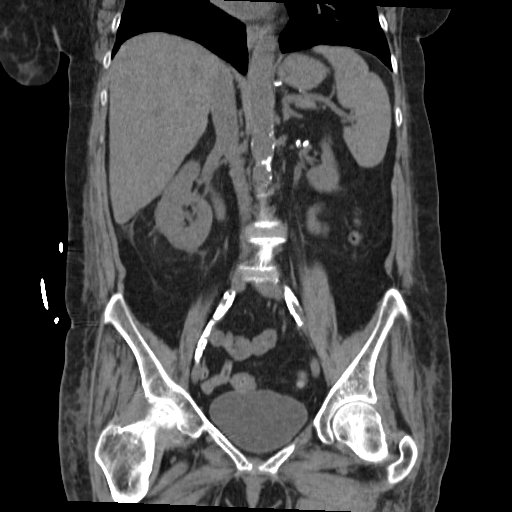

[16 of 46 positions shown; findings below may reference images not displayed]

FINDINGS: The liver exhibits no focal mass nor ductal dilation. The
gallbladder is surgically absent. The spleen exhibits normal density
and contour and does not exceed 11.2 cm in greatest dimension. The
pancreas, adrenal glands, and kidneys exhibit no acute
abnormalities. The left kidney is atrophic and exhibits several 1-2
mm diameter nonobstructing upper pole stones. There is a 2 mm
diameter midpole nonobstructing stone on the right. There is a small
amount of increased density in the perinephric fat on the right
without evidence of a perinephric hematoma. There is no free fluid
in the abdomen or pelvis. The psoas musculature is normal in density
and contour with no evidence of a hematoma.

The abdominal aorta exhibits failure to taper of its caliber. There
is mural calcification but no evidence of an aneurysm. The patient
has undergone stent placement in the proximal common iliac arteries
bilaterally. There is no evidence of periaortic or peri-iliac
hemorrhage.

The stomach is nondistended. The non-opacified loops of small and
large bowel exhibit no evidence of ileus nor of obstruction. There
is sigmoid diverticulosis without evidence of acute diverticulitis.
The partially distended urinary bladder is normal in appearance. The
uterus is surgically absent. No adnexal masses are demonstrated.
There is no inguinal nor significant umbilical hernia.

The lung bases exhibit mild compressive atelectasis posteriorly with
small amounts of pleural thickening or fluid posteriorly. The lumbar
vertebral bodies are preserved in height. There is disc space
narrowing at L4-5 with posterior hard disc bulging. The bony pelvis
exhibits no evidence of an acute fracture nor lytic or blastic
lesion. There is mild degenerative change of the right hip.

There is no evidence of a subcutaneous or deeper hematoma. Minimally
increased density in the subcutaneous fat over the hips bilaterally
is nonspecific.
IMPRESSION: 1. There is no evidence of an intraperitoneal or retroperitoneal
hemorrhage. No significant abnormality within the subcutaneous or
deeper soft tissues over the abdomen or pelvis is demonstrated.
2. There is no evidence of acute hepatobiliary abnormality.
3. There are chronic changes within the kidneys with left renal
atrophy and bilateral nonobstructing stones. Mildly increased
density in the perinephric fat on the right is nonspecific. There is
no evidence of obstruction.
4. There is diverticulosis of the sigmoid colon but there is no
evidence of bowel obstruction or ileus.
5. There is mild atelectasis and minimal pleural thickening/fluid in
the posterior costophrenic gutters bilaterally.

## 2015-04-22 NOTE — Consult Note (Signed)
PATIENT NAME:  Lori Cooke, Lori MR#:  [id number] DATE OF BIRTH:  07/30/1951  DATE OF CONSULTATION:  10/14/2013  CONSULTING PHYSICIAN:  Audery AmelJohn T. Cortasia Screws, MD  IDENTIFYING INFORMATION AND REASON FOR CONSULTATION: A 64 year old woman who came to the Emergency Room complaining of diffuse musculoskeletal pain. Consultation because of possible suicidal threats.   HISTORY OF PRESENT ILLNESS: Information obtained from the patient and the chart, as well as discussion with the intake psychiatry nurse. The patient came to the Emergency Room complaining of pain going down the right side of her body which is chronic and long-standing. She was requesting to be given narcotic pain medicine. When she was advised that pain medicine  would not be prescribed for chronic conditions, she told the Emergency Room doctor that she would kill herself if she was not given pain medicine. The patient was subsequently put under involuntary commitment paperwork. She tells me today that she is frustrated because of her chronic pain, which she has had for several years. She used to see a pain specialist in [place] but refuses to go back to them because she thinks they prescribed her the wrong medicine at one point. She moved to this part of the [place] months ago and has not been getting followup treatment for her pain. She says that she feels like she needs stronger pain medicine. She occasionally has thoughts of hopelessness, but denies that she has any actual intention to try to kill herself. She says her mood is chronically down and irritable, but not constantly negative. She does not report any psychotic symptoms. Her sleep is poor. Energy level chronically poor. She has not been going to see an outpatient psychiatric provider either. It is not entirely clear what medication she has been taking.   PAST PSYCHIATRIC HISTORY: She says that she has had overdose attempts in the past, most recently about 10 years ago.  She has had  psychiatric hospitalizations including one within the last couple of years at [place]. She is unclear about a diagnosis. She cannot tell me whether any medication has clearly helped her with her mood. Her most recent medicine list suggests that she is taking quetiapine 100 mg at night. She used to take Xanax. Looking at the controlled substance database, it looks like she has gotten occasional small doses of Xanax but does not have a regular provider right now.   SOCIAL HISTORY: Lives by herself but has a home health aide who comes to see her every day for part of the day. It sounds like she is estranged from her family.   PAST MEDICAL HISTORY: Multiple medical problems including COPD, chronic pain, gastric  reflux symptoms, high blood pressure.   SUBSTANCE ABUSE HISTORY: Denies that she drinks or abuses alcohol or has ever abused alcohol. Admits that she has sometimes abused prescription medicines in the past but says it has not been a chronic problem.   REVIEW OF SYSTEMS: Mostly focused on pain in her right side. Also feeling somewhat anxious. No other physical symptoms currently. No pulmonary, GI or cardiac symptoms.   CURRENT MEDICATIONS: The list we have on the chart is lengthy and includes Norco, which I do not think she is currently being prescribed; azithromycin, which we do not know when that was done either. Advair Diskus, clopidogrel, Crestor, tramadol, prednisone, phenytoin, gabapentin, pantoprazole, quetiapine, metoprolol, tiotropium, diltiazem, amlodipine and guanfacine.   ALLERGIES: THE PATIENT SAYS SHE IS ALLERGIC TO SULFA DRUGS.   MENTAL STATUS EXAMINATION:  A 64 year old woman, looks older  than her stated age. Somewhat disheveled. Good eye contact. Normal psychomotor activity. Speech normal in rate, tone and volume.  Affect mildly anxious and irritable. Mood stated as being okay. Thoughts are lucid without loosening of associations or delusions. Denies auditory or visual  hallucinations. Denies any current suicidal or homicidal ideation. Says she has no wish to die and has positive things in her life to live for. She is alert and oriented x 4. Normal intelligence. Judgment and insight, at least at the moment, appear to be improved and stable.   ASSESSMENT: This is a 64 year old woman who appears to have a chronic pain problem for which she has been prescribed narcotics in the past but is not currently taking any narcotics. She was seeking narcotic pain medicine here, but does not have an acute syndrome that would justify narcotic pain management. In fact, she appears to be able to ambulate and use all of her extremities normally and does not look to be in any terrible pain. The patient made suicidal threats and what seems to clearly be a manipulative attempt to have narcotics prescribed for her. Now that it has been made clear to her that this strategy is not likely to get her what she wants, she is denying suicidal ideation. Although her past history would make her risk of suicide above, baseline, I do not think that anything we are likely to do in the hospital is going to necessarily improve that, and right now she is denying suicidal ideation and appears lucid and has positive factors to live for and is agreeable to outpatient treatment. I do not think she meets commitment criteria anymore.   TREATMENT PLAN: Supportive and educational therapy done along with encouragement for her to see an outpatient psychiatric provider, as well as a pain management doctor. I advised taking her off commitment and releasing her from the hospital. She is agreeable to this. She understands that if she is having more serious suicidal thoughts, she can always come back to the hospital. She will be referred to a local mental health agency.   DIAGNOSIS, PRINCIPAL AND PRIMARY:  AXIS I: Adjustment disorder with mixed disturbance of mood and behavior.   SECONDARY DIAGNOSES: AXIS I: No further  diagnosis.  AXIS II: Deferred.  AXIS III: Chronic pain, chronic obstructive pulmonary disease, high blood pressure.  AXIS IV: Severe from social isolation.  AXIS V: Functioning at time of evaluation 55.    ____________________________ Audery Amel, MD jtc:dmm D: 10/14/2013 12:57:03 ET T: 10/14/2013 13:08:50 ET JOB#: [id number]  cc: Audery Amel, MD, <Dictator> Audery Amel MD ELECTRONICALLY SIGNED 10/14/2013 17:37

## 2015-04-22 NOTE — Consult Note (Signed)
PATIENT NAME:  Lori Cooke, Lori Cooke MR#:  [id number] DATE OF BIRTH:  [birth year]  DATE OF CONSULTATION:  10/27/2013  REFERRING PHYSICIAN:  Dr. Imogene Burn CONSULTING PHYSICIAN:  Verta Ellen, PA-C  PRIMARY CARE PHYSICIAN: Dr. Marguerite Olea   REASON FOR CONSULTATION: Elevated troponin, shortness of breath.   HISTORY OF PRESENT ILLNESS: Ms. Lori Cooke is a 64 year old white female with a past medical history significant for tobacco abuse, hypertension, diabetes, coronary artery disease, COPD and aortic valve replacement. The patient has chronic pain in her upper back radiating around into her chest that she has had for the last 2 years that is sharp and has not changed in intensity recently. She also complains of intense pain in the right hip radiating into the right groin and leg that is constant. Due to her increased hip pain, she has had increased shortness of breath with productive cough with clear phlegm, 4 pillow orthopnea. The patient again does not note any change in her baseline sharp chest pain which she attributed to musculoskeletal etiology. The patient has extensive cardiac history with a history of coronary artery disease with multiple stents, the last one was placed at [facility] by Dr. Linwood Dibbles of [place], [place]. She has had an echocardiogram within the last year as well as a stress test. (These records are not currently available for review).   PAST MEDICAL HISTORY: 1.  Hypertension.  2.  Diabetes mellitus, diet controlled.  3.  Coronary artery disease with history of 3 stents, last stent placed about a year ago at [facility] on by Dr. Linwood Dibbles.  4.  Chronic respiratory failure on home oxygen at 2 liters per minute.  5.  Aortic aneurysm repair (thoracic aorta).  6.  Aortic valve replacement with porcine valve approximately 8 to 9 years at [facility].  7.  Hyperlipidemia.  8.  Neuropathy.  9.  Anxiety with depression.  10.  Seizure disorder, last  seizure about 8 to 9 years ago.  11.  Left-sided carotid stenosis status post stent placement at [facility] approximately 10 to 12 years ago. The patient states she has had recent Doppler which showed patent stent in the last year.   PAST SURGICAL HISTORY: 1.  Hysterectomy.  2.  Aortic valve replacement (porcine) done at [facility] about 9 years ago.  3.  Status post cholecystectomy.  4.  Status post goiter removal.  5.  Carotid stenting on the left. 6.  Back surgery.   ALLERGIES: P.o. MORPHINE, PENICILLIN, SULFA, LASIX, TAPE.   HOME MEDICATIONS: 1.  Spiriva 18 mcg 1 cap inhaled daily.  2.  Quetiapine 100 mg p.o. at bedtime.  3.  Pantoprazole 40 mg p.o. daily.  4.  Lopressor 25 mg p.o. daily.  5.  Gabapentin 300 mg 2 capsules p.o. every 8 hours.  6.  Diltiazem 180 mg p.o. daily.  7.  Crestor 10 mg p.o. at bedtime.  8.  Plavix 75 mg p.o. daily.  9.  Norvasc 10 mg p.o. daily.  10.  Advair 250 mcg/50 mcg inhaled twice daily.  11.  Xanax 0.25 mg 3 times daily.   SOCIAL HISTORY: The patient has smoked half pack a day for many years, she used to smoke up to 3 packs daily. Denies any alcohol or illicit drug use. She drinks approximately 3 cups of coffee per day.   FAMILY HISTORY: Coronary artery disease with aortic valve replacement in brother and sister, hypertension and diabetes also run in her family.  REVIEW OF SYSTEMS: CONSTITUTIONAL: The patient complains of fever, chills, weakness.  EYES: The patient denies any double vision, blurred vision.  HEENT: The patient denies any postnasal drip, slurred speech, dysphagia. PULMONARY: The patient complains of shortness of breath, coughing with sputum.  CARDIOVASCULAR: The patient has chronic sharp chest pain that is radiating from the back. This is unchanged from her baseline, no palpitations.  GASTROINTESTINAL: The patient denies any abdominal pain, nausea and vomiting.  MUSCULOSKELETAL: The patient complains of chronic  back pain radiating into the chest, right hip pain radiating to the groin and leg.   PHYSICAL EXAMINATION: GENERAL: This is a pleasant female who is not in any acute distress. She is alert and oriented x 3.  VITAL SIGNS: Temperature 98 degrees Fahrenheit, heart rate 62, respiratory rate 13, blood pressure 107/60 and O2 sat is 96% on 3 L/min.  HEENT: Head: Atraumatic, normocephalic. Eyes: Pupils round, equal. Conjunctivae pale, pink. Ears and nose are normal to external inspection. Mouth: Moist mucous membranes.  NECK: Supple. Trachea is midline. No carotid bruits appreciated. No JVD.  RESPIRATORY: The patient has bilateral wheezing, no crackles appreciated. No accessory muscle use. The patient is on supplemental oxygen.  CARDIOVASCULAR: The patient has regular rate and rhythm with a murmur noted over the aortic area.  ABDOMEN: Soft. Bowel sounds present in all 4 quadrants, soft and nontender to palpation.  EXTREMITIES: The patient has 1+ pedal edema bilaterally.   LABORATORY AND DIAGNOSTICS: EKG on admission with normal sinus rhythm in the 60s, telemetry also shows the same.   Chest x-ray: No evidence of acute cardiopulmonary disease.   Glucose 133. BNP 325. BUN 8, creatinine 0.75, sodium 139, potassium 3.8, chloride 104, CO2 29. Estimated GFR is greater than 60. Magnesium 1.6. Hemoglobin A1c 5.6. Troponin I 0.11, second troponin 0.05. TSH 0.50. White blood cell count is 9, hemoglobin is 11.4, hematocrit 32.3 and platelet count 190,000. Acetaminophen level less than 2. Salicylate is 2.9.   ASSESSMENT AND PLAN: 1.  Acute shortness of breath in a patient with chronic obstructive pulmonary disease. The patient likely has chronic obstructive pulmonary disease exacerbation. Was originally put on BiPAP, on Solu-Medrol, DuoNeb, Spiriva and Advair. Breathing has somewhat improved at this time. 2.  Mildly elevated troponin. The patient does have a history of coronary artery disease with history of stents,  last one placed at [facility] by Dr. Linwood Dibbles. She has had stress test done by that physician within the last 6 to 12 months. Most likely her troponin is from demand ischemia from respiratory distress. Troponin is trending down.  3.  Coronary artery disease. The patient has not had a history of acute coronary syndrome, however, has 3 stents, the last one placed approximately 1 year ago. The patient is on beta blocker, statin, Plavix and aspirin at home, which should be continued.  4.  Aortic valve replacement. The patient has a history of porcine aortic valve that was done 8 to 9 years ago at [facility]. States she has had an echocardiogram in the past that shows a normal functioning valve. Per her history, it sounds like she may have had aortic arch/thoracic aorta repair. We will get an echocardiogram to check wall motion, assess the aortic valve and rule out any wall motion abnormalities.   Thank you very much for this consultation and allowing [place] to participate in this patient's care. We will continue to follow this patient with you. ____________________________ Verta Ellen, PA-C mam:sb D: 10/27/2013 08:33:59 ET  T: 10/27/2013 09:24:01 ET JOB#: [id number]  cc: Verta EllenMonica A. Oluwadarasimi Favor, PA-C, <Dictator> Durward MallardJoel B. Marguerite OleaMoffett, MD Christapher Gillian A [facility] PA ELECTRONICALLY SIGNED 11/10/2013 13:16

## 2015-04-22 NOTE — Discharge Summary (Signed)
PATIENT NAME:  Lori Cooke, Lori Cooke MR#:  [id number] DATE OF BIRTH:  March 02, 1951  DATE OF ADMISSION:  12/07/2013 DATE OF DISCHARGE:  12/14/2013  ADMITTING DIAGNOSIS: Chronic obstructive pulmonary disease exacerbation.   DISCHARGE DIAGNOSES: 1.  Acute on chronic respiratory failure due to chronic obstructive pulmonary disease exacerbation, acute bronchitis.  2.  Diastolic congestive heart failure, acute.  3.  Moderate aortic stenosis.  4.  Hypertension.  5.  Coronary artery disease.  6.  History of coronary artery bypass grafting.  7.  Depression, anxiety.  8.  History of hypertension.  9.  Hyperlipidemia.  10.  Diabetes mellitus.  11.  Chronic obstructive pulmonary disease.  12.  Chronic respiratory failure, oxygen dependent.  13.  Aortic aneurysm.  14.  Seizure disorder.  15.  Left carotid stenosis.  16.  Aortic valve replacement with porcine valve about 8 years ago.   DISCHARGE CONDITION: Stable.   DISCHARGE MEDICATIONS: The patient is to resume her outpatient medications which are: 1.  Plavix 75 mg p.o. daily.  2.  Magnesium oxide 400 mg p.o. 1 to 2 times a day.  3.  Protonix 40 mg p.o. daily.  4.  Gabapentin 600 mg p.o. every 8 hours.  5.  Crestor 10 mg p.o. at bedtime.  6.  Quetiapine, which is Seroquel, 100 mg p.o. at bedtime.  7.  Metoprolol succinate 25 mg p.o. once daily.  8.  Advair Diskus 250/50 one puff twice daily.  9.  Tiotropium 18 mcg inhalation once daily.  10.  Diltiazem extended-release 180 mg p.o. daily.  11.  Ventolin HFA 2 puffs every 4 hours or 4 times daily as needed.  12.  Lexapro 10 mg p.o. daily.  13.  Prednisone tapering 40 mg p.o. once on 12/15/2013, then taper by 10 mg daily until stopped.  14.  Theophylline extended-release 100 mg p.o. twice daily.  15.  Furosemide 20 mg p.o. daily. 16.  Guaifenesin which is Humibid 600 mg p.o. twice daily.  17.  Docusate/senna 50/8.6 one tablet once daily as needed.  18.  MiraLax 17 grams once daily as needed.  19.   Levofloxacin 500 mg p.o. every 24 hours for 3 more days.  20.  Nicotine oral inhaler 1 inhalation up to 6 times daily as needed.  21.  Tussionex 5 mL every 12 hours.  22.  Home oxygen at 2 liters of oxygen through nasal cannula 24/7.   DIET: 2 grams salt, low fat, low cholesterol, carbohydrate-controlled diet, regular consistency.   ACTIVITY LIMITATIONS: As tolerated.    FOLLOWUP APPOINTMENT: With Dr. Marguerite OleaMoffett in 2 days after discharge.   CONSULTANTS: Care management, social work, Dr. Darrold JunkerParaschos, cardiology; dr. Freda MunroSaadat Khan, pulmonary.   RADIOLOGIC STUDIES: Chest x-ray, PA and lateral, 12/07/2013 revealing no acute cardiopulmonary disease.  Chest x-ray repeated, PA and lateral, 12/11/2013 revealed mild prominence of interstitial markings which may reflect sequelae of pulmonary vascular congestion, also infectious or inflammatory interstitial infiltrates of infectious or inflammatory etiology, interstitial infiltrate was also diagnostic consideration if clinically appropriate according to radiologist. Repeated chest x-ray, PA and lateral, 12/13/2013 revealed decreased  conspicuity of interstitial findings, likely reflecting dissolving edema; underlying component of pulmonary fibrosis, mild, is also a diagnostic consideration; no focal lesions or consolidation or focal infiltrates were noted.   HOSPITAL COURSE:  The patient is a 64 year old Caucasian female with past medical history significant for history of chronic respiratory failure, who presents to the hospital with complaints of shortness of breath. Please refer to Dr. Lupe CarneyAhmadzia's admission note on  the 12/07/2013. Apparently, the patient was using oxygen and over the past 2 to 3days she was having cough and wheezing and shortness of breath and white productive sputum. She had to use numerous inhalers.  On arrival to the Emergency Room, she received multiple nebulizers as well as IV steroids and still did not feel better so the hospitalist services  were contacted for admission. In the Emergency Room, her vitals were stable with temperature of 98.2, pulse was 79, respiratory rate was 22, blood pressure 121/86, O2 sats were 97% on oxygen therapy. Physical exam showed extrinsic wheezing overshadowing heart sounds. The patient's lab data done on admission, on 12/07/2013, revealed normal BMP. The patient's liver enzymes revealed mild elevation of elevation of alkaline phosphatase to 202. Troponin was less than 0.02. White blood cell count was normal at 7.2, hemoglobin was 13.5, platelet count was 211. EKG showed normal sinus rhythm at 80 beats per minute, nonspecific ST-T changes but no significant change since prior EKG done in November 2014. The patient's chest x-ray was unremarkable.   She was admitted to the hospital for further evaluation and treatment for suspected bronchitis and acute COPD exacerbation. With antibiotic therapy as well as steroids and inhalation therapy, she has been somewhat improved but not significantly. She had her chest x-ray repeated again. She was found to have possible pulmonary fibrosis versus fluid collection, congestive heart failure. BNP at that point was performed which was found to be elevated at 2123. It was felt that the patient's condition could have been deteriorating and she is developing diastolic congestive heart failure. She had echocardiogram done on 12/13/2013 which was remarkable for left ventricular ejection fraction by visual estimation of 60% to 65%, normal global left ventricular systolic function, impaired relaxation pattern of left ventricular diastolic filling, also mild left ventricular hypertrophy and mild mitral valve regurgitation. Also moderate aortic valve stenosis was noted as well as mildly increased left ventricular posterior wall thickness and mild tricuspid regurgitation. Because of the aortic stenosis, cardiology consultation was obtained. Dr. Darrold Junker saw the patient in consultation and felt  that there is no need for full-dose anticoagulation. He recommended to continue diuresis and no further cardiac invasive or noninvasive diagnostics at this time were recommended. He felt that patient's echocardiogram revealed normal left ventricular function with, at most, moderate aortic stenosis with a mild mean gradient.  We  continued the patient on diuretics and her condition improved. Her breathing became less labored and her oxygenation also improved. On the day of discharge, the patient's vital signs were stable with temperature 97.8, pulse of 71, respiratory rate was 20, blood pressure 147/78, saturation was 92% to 96% on 2 liters of oxygen through nasal cannula at rest. It was felt that she is stable to be discharged home. She is, however, to follow up with her primary care physician as well as possibly cardiologist as well as pulmonologist in the future.   TIME SPENT:  40 minutes.    ____________________________ Katharina Caper, MD rv:cs D: 12/14/2013 19:25:00 ET T: 12/14/2013 20:07:32 ET JOB#: [id number]  cc: Francis Dowse B. Marguerite Olea, MD Katharina Caper, MD, <Dictator>  Prudy Candy MD ELECTRONICALLY SIGNED 12/26/2013 19:11

## 2015-04-22 NOTE — Consult Note (Signed)
Brief Consult Note: Diagnosis: Elevated TNI.   Comments: Patient with respiratory failure/COPD exacerbation, improved and elevated TNI likely from demand ischemia. She has chronic upper back and CP that has been unchanged for the last 2 yrs, has had recent [facility] and thinks was normal. Will get echo to check wall motion, check porcine aortic valve, check LVEF. BP well controlled, on Plavix, BB, statin which should be continued.  Electronic Signatures: Radene KneeKhan, Shaukat Ali (MD)   (Signed 28-Oct-14 09:09)  Co-Signer: Brief Consult Note Verta EllenManzi, Latrelle Bazar A (PA-C)   (Signed 28-Oct-14 08:39)  Authored: Brief Consult Note  Last Updated: 28-Oct-14 09:09 by Radene KneeKhan, Shaukat Ali (MD)

## 2015-04-22 NOTE — Consult Note (Signed)
Brief Consult Note: Diagnosis: adjustment disorder.   Patient was seen by consultant.   Consult note dictated.   Discussed with Attending MD.   Comments: Psychiatry: Patient seen and chart reviewed. Discussed case with ER doctor. PAtient now denies any suicidal ideation and is calm and lucid and has agreed to outpt follow up. NNo longer acutely dangerous. Suggest release from ER and follow up with outpt psych.  Electronic Signatures: Audery Amellapacs, John T (MD)  (Signed 15-Oct-14 12:47)  Authored: Brief Consult Note   Last Updated: 15-Oct-14 12:47 by Audery Amellapacs, John T (MD)

## 2015-04-22 NOTE — Discharge Summary (Signed)
PATIENT NAME:  Lori Cooke, Lori Cooke MR#:  [id number] DATE OF BIRTH:  1951-06-13  DATE OF ADMISSION:  10/26/2013 DATE OF DISCHARGE:  10/28/2013 PRIMARY CARE PHYSICIAN: Dr. Marguerite OleaMoffett.   DISCHARGE DIAGNOSES:  1.  Acute-on-chronic respiratory failure.  2.  Chronic obstructive pulmonary disease exacerbation.  3.  Acute bronchitis.  4.  Ongoing tobacco abuse.  5.  Hypertension.   CONDITION: Stable.   CODE STATUS: FULL CODE.   HOME MEDICATIONS: [facility].   DISCHARGE SERVICES: The patient needs home health and physical therapy. Needs home oxygen 2 liters by nasal cannula.   DIET: Low-sodium diet.   ACTIVITY: As tolerated.   FOLLOWUP CARE: Follow up with PCP within 1 to 2 weeks. The patient needs smoking cessation.   REASON FOR ADMISSION: Shortness of breath, cough, sputum, wheezing for 3 days.   HISTORY OF PRESENT ILLNESS: A 64 year old Caucasian female with a history of COPD, hypertension, diabetes, CAD, who presented to the ED with cough, sputum, wheezing, shortness of breath for 3 days. The patient was noted to have respiratory distress and was placed on BiPAP  in the ED.   For detailed history and physical examination, please refer to the admission note dictated by me.  1.  The patient was admitted for acute-on-chronic respiratory failure with COPD exacerbation. The patient was initially placed on BiPAP. The patient has been treated with supplemental DuoNebs, Spiriva, Advair. The patient's symptoms have much improved. The patient is BiPAP, on home oxygen by nasal cannula 3 L this morning. Physical examination only showed mild wheezing.   2.  For tobacco abuse, the patient was counseled for smoking cessation. Nicotine patch was given.  3.  The patient also has elevated troponin which was 0.11 and decreased to normal range, which is possibly due to demand ischemia. The patient has been treated with Plavix, Crestor, and cardiology  consult suggested demand ischemia. No further cardiac workup. Continue Lasix. Continue Plavix, beta blocker and statin.   4.  The patient is clinically stable and will be discharged.   5.  According to PT evaluation, the patient needs physical therapy, needs a rehab placement. However, the patient has declined rehab placement, so the patient will be discharged to home with home health and physical therapy.   Discussed the patient's discharge plan with the patient, nurse, and case manager.   TIME SPENT: About 37 minutes.   ____________________________ Shaune PollackQing Zoltan Genest, MD qc:np D: 10/28/2013 16:45:33 ET T: 10/28/2013 17:24:42 ET JOB#: [id number]  cc: Shaune PollackQing Janace Decker, MD, <Dictator> Shaune PollackQING Lillionna Nabi MD ELECTRONICALLY SIGNED 10/28/2013 18:30

## 2015-04-22 NOTE — H&P (Signed)
PATIENT NAME:  Lori Cooke, Lori Cooke MR#:  [id number] DATE OF BIRTH:  20-Feb-1951  DATE OF ADMISSION:  10/26/2013  PRIMARY CARE PHYSICIAN: Dr. Marguerite Olea.   REFERRING PHYSICIAN: Dr. Glenetta Hew.   CHIEF COMPLAINT: Shortness of breath, cough, sputum, wheezing for the past 3 days.   HISTORY OF PRESENT ILLNESS: A 64 year old Caucasian female with a history of hypertension, diabetes, CAD, COPD on home oxygen. Presents to the ED with cough, sputum, shortness of breath and wheezing for the past 3 days. The patient is alert, awake, oriented, in no acute distress now. The patient complains of cough, sputum, shortness of breath, wheezing for 3 days. In addition, the patient had a fever and chills yesterday. The patient denies any chest pain, palpitation, orthopnea, or nocturnal dyspnea. No leg edema. The patient was noted to have respiratory distress and is being placed on BiPAP in the ED. The patient also complains of chronic back pain with right leg pain.   PAST MEDICAL HISTORY: Hypertension, diabetes, CAD, COPD, chronic respiratory failure on home oxygen 2 liters, aortic aneurysm, aortic valve replacement with a porcine valve, hyperlipidemia, neuropathy, anxiety, depression, seizure disorder left-sided carotid stenosis.   PAST SURGICAL HISTORY: Hysterectomy, aortic valve replacement, gallbladder surgery, goiter surgery.   SOCIAL HISTORY: The patient has smoked 1/2 pack a day for many years. Used to smoke up to 3 packs a day. Denies any alcohol drinking or illicit drugs.   FAMILY HISTORY: Hypertension, diabetes, depression, as well as aortic valve replacement.   ALLERGIES: MORPHINE, PENICILLIN, SULFA DRUGS, LATEX AND TAPE.   HOME MEDICATIONS:  1. Spiriva 18 mcg 1 cap once a day.  2. Quetiapine 100 mg p.o. at bedtime.  3. Pantoprazole 40 mg p.o. daily.  4. Lopressor 25 mg p.o. daily.  5. Gabapentin 300 mg p.o. 2 cabs every 8 hours.  6. Diltiazem 180 mg p.o. daily.  7. Crestor 10 mg p.o. daily at bedtime.  8.  Plavix 75 mg p.o. daily.  9. Norvasc 10 mg p.o. daily.  10. Advair 250 mcg/50 mcg inhalation twice a day.   REVIEW OF SYSTEMS:   CONSTITUTIONAL: The patient has fever, chills. No headache or dizziness but has generalized weakness.  EYES: No double vision, blurry vision.  ENT: No postnasal drip, slurred speech or dysphagia.  CARDIOVASCULAR: No chest pain, palpitation, orthopnea or nocturnal dyspnea. No leg edema.  PULMONARY: Positive for cough, sputum, shortness of breath and wheezing. No hemoptysis.  GASTROINTESTINAL: No abdominal pain, nausea, vomiting or diarrhea. No melena or bloody stool.  GENITOURINARY: No dysuria, hematuria or incontinence.  SKIN: No rash or jaundice.  NEUROLOGY: No syncope, loss of consciousness or seizure.  ENDOCRINE: No polyuria, polydipsia, heat or cold intolerance.  HEMATOLOGIC: No easy bleeding or bleeding.   PHYSICAL EXAMINATION:  VITAL SIGNS: Temperature 100.4, blood pressure 142/68, pulse 79, O2 saturation 99% on BiPAP.  GENERAL: The patient is alert, awake, oriented, in no acute distress on BiPAP.  HEENT: Pupils round, equal and reactive to light and accommodation. Dry oral mucosa. No discharge from ears or nose.  NECK: Supple. No JVD or carotid bruits. No lymphadenopathy. No thyromegaly.  CARDIOVASCULAR: S1, S2. Regular rate, rhythm. No murmurs or gallops.  RESPIRATORY: Positive respiratory failure but bilateral air entry. Bilateral wheezing but no crackles or rales. No use of accessory muscles to breathe.  ABDOMEN: Obese, soft. No distention. No tenderness. No organomegaly. Bowel sounds present.  EXTREMITIES: No edema, clubbing or cyanosis. No calf tenderness. Bilateral pedal pulses present.  SKIN: No rash or jaundice.  NEUROLOGY:  A and O x 3. No focal deficit. Power 5 out of 5 on the left side but about 3 to 4 out of 5 in the right lower extremity due to pain. DTRs 2+.   LABORATORY DATA: Glucose 133, BUN 8, creatinine 0.75. Electrolytes normal. WBC  14.4, hemoglobin 12.9, platelets 218. Troponin 0.11. BNP is 325.   EKG showed normal sinus rhythm at 87 bpm.   CHEST X-RAY: No interval change. COPD changes.  IMPRESSIONS:  1. Acute on chronic respiratory failure.  2. Chronic obstructive pulmonary disease exacerbation.  3. Acute bronchitis with leukocytosis.  4. Elevated troponin.  5. Hypertension.  6. Diabetes.  7. Coronary artery disease.  8. Hyperlipidemia.  9. Tobacco abuse.   PLAN OF TREATMENT:  1. The patient will be admitted to stepdown unit. Will continue BiPAP. Start Solu-Medrol, DuoNeb and continue Spiriva, Advair and follow up CBC.  2. For elevated troponin, which is possibly due to demand ischemia, will continue Plavix, Crestor and get a cardiology consult.  3. Will continue the patient's hypertension medication.  4. Sliding scale for diabetes.  5. Smoking cessation was counseled for 5 to 6 minutes.   Discussed the patient's condition and the plan of treatment with the patient.   TIME SPENT: About 58 minutes.   ____________________________ Shaune PollackQing Ketih Goodie, MD qc:gb D: 10/26/2013 21:50:07 ET T: 10/26/2013 22:27:55 ET JOB#: [id number]  cc: Shaune PollackQing Jumaane Weatherford, MD, <Dictator> Shaune PollackQING Asyia Hornung MD ELECTRONICALLY SIGNED 10/28/2013 18:26

## 2015-04-22 NOTE — Discharge Summary (Signed)
PATIENT NAME:  Lori Cooke, Lori Cooke MR#:  [id number] DATE OF BIRTH:  25-Nov-1951  PRIMARY CARE PHYSICIAN: Dr. Marguerite OleaMoffett.   DISCHARGE DIAGNOSES:  1.  Acute-on-chronic respiratory failure.  2.  Chronic obstructive pulmonary disease exacerbation.  3.  Acute bronchitis.  4.  Ongoing tobacco abuse.  5.  Hypertension.   CONDITION: Stable.   CODE STATUS: FULL CODE.   HOME MEDICATIONS: [facility] medication reconciliation list.   DISCHARGE SERVICES: The patient needs home health and physical therapy. Needs home oxygen 2 liters by nasal cannula.   DIET: Low-sodium diet.   ACTIVITY: As tolerated.   FOLLOWUP CARE: Follow up with PCP within 1 to 2 weeks. The patient needs smoking cessation.   REASON FOR ADMISSION: Shortness of breath, cough, sputum, wheezing for 3 days.   HISTORY OF PRESENT ILLNESS: A 64 year old Caucasian female with a history of COPD, hypertension, diabetes, CAD, who presented to the ED with cough, sputum, wheezing, shortness of breath for 3 days. The patient was noted to have respiratory distress and was placed on BiPAP  in the ED.   For detailed history and physical examination, please refer to the admission note dictated by me.  1.  The patient was admitted for acute-on-chronic respiratory failure with COPD exacerbation. The patient was initially placed on BiPAP. The patient has been treated with supplemental DuoNebs, Spiriva, Advair. The patient's symptoms have much improved. The patient is BiPAP, on home oxygen by nasal cannula 3 L this morning. Physical examination only showed mild wheezing.   2.  For tobacco abuse, the patient was counseled for smoking cessation. Nicotine patch was given.  3.  The patient also has elevated troponin which was 0.11 and decreased to normal range, which is possibly due to demand ischemia. The patient has been treated with Plavix, Crestor, and cardiology consult suggested demand ischemia. No further cardiac workup.  Continue Lasix. Continue Plavix, beta blocker and statin.   4.  The patient is clinically stable and will be discharged.   5.  According to PT evaluation, the patient needs physical therapy, needs a rehab placement. However, the patient has declined rehab placement, so the patient will be discharged to home with home health and physical therapy.   Discussed the patient's discharge plan with the patient, nurse, and case manager.   TIME SPENT: About 37 minutes.   ____________________________ Shaune PollackQing Margorie Renner, MD qc:np D: 10/28/2013 16:45:33 ET T: 10/28/2013 17:24:42 ET JOB#: [id number]  cc: Shaune PollackQing Jacqueline Delapena, MD, <Dictator> Shaune PollackQING Sareen Randon MD ELECTRONICALLY SIGNED 10/28/2013 18:30

## 2015-04-22 NOTE — H&P (Signed)
PATIENT NAME:  Lori Cooke, Lori Cooke MR#:  [id number] DATE OF BIRTH:  1951-08-07  DATE OF ADMISSION:  09/07/2013  PRIMARY CARE PHYSICIAN: The patient will be going to see Dr. Suzie PortelaMoffitt.  REFERRING PHYSICIAN: Dr. Fanny BienQuale.   CHIEF COMPLAINT: Shortness of breath.   HISTORY OF PRESENT ILLNESS: The patient is a 64 year old Caucasian female with a history of chronic respiratory failure, COPD, hypertension, status post aortic valve replacement with porcine valve. She also has CAD. She came in after 2 days of shortness of breath and a nonproductive cough without any fevers or chills, but has a pleuritic chest pain on the right with deep inspirations and cough. She, of note, also has been smoking. She is supposed to be on oxygen, but during the day, she uses it as needed. She has been using her nebulizers without significant improvement and she came into the hospital. She called EMS where she was brought in to the hospital. She was given stacked Nebs as well as 125 mg of Solu-Medrol. The patient was tachypneic when she came in and in significant distress. She feels somewhat better, but has significant leukocytosis and possible right-sided pneumonia, so hospitalists were contacted for further evaluation and management.   PAST MEDICAL HISTORY: Hypertension, aortic valve replacement with porcine valve, chronic respiratory failure, aortic aneurysm, CAD, hyperlipidemia, neuropathy, history of diabetes, previously on metformin, but now off of it, anxiety, depression, history of seizure disorder and history of left-sided carotid stenosis.   ALLERGIES: SULFA, PENICILLIN, MORPHINE, LATEX AND TAPE.   PAST SURGICAL HISTORY: Hysterectomy, aortic valve replacement, gallbladder surgery and goiter surgery.   FAMILY HISTORY: Diabetes, hypertension and depression runs in her family as well as aortic valve replacement.   SOCIAL HISTORY: 50 year smoker. She used to smoke up to 3 packs a day and now has cut down to half a pack. No alcohol  or drug use.  REVIEW OF SYSTEMS: CONSTITUTIONAL: No fever. No weight tissues. EYES: No blurry vision or double vision. ENT: Has chronic decreased hearing. No epistaxis. RESPIRATORY: Positive for dry cough, wheezing, shortness of breath and chronic obstructive pulmonary disease.  CARDIOVASCULAR: Has pleuritic chest pain with respirations cough and orthopnea. Occasional swelling in the legs. No arrhythmia. GASTROINTESTINAL: Had some nausea. No vomiting. Some loose stools since yesterday. No abdominal pain. No bloody stools. GENITOURINARY: Denies dysuria or hematuria. HEMATOLOGIC AND LYMPHATICS: No anemia or easy bruising. SKIN: No rashes. MUSCULOSKELETAL: Has some chronic back pain shooting to right leg. NEUROLOGIC: No focal weakness or numbness. PSYCHIATRIC: Has anxiety and depression.   PHYSICAL EXAMINATION:  VITAL SIGNS: Temperature on arrival 97.7, pulse rate initially 96, respiratory rate 32. Last respiratory rate is 28. Initial blood pressure 137/78. Oxygen saturation was 97% on room air.  GENERAL: The patient is a Caucasian female lying in bed in no obvious distress.  HEENT: Normocephalic, atraumatic. Pupils are equal and reactive. Anicteric sclerae. Extraocular muscles intact. Moist mucous membranes.  NECK: Supple. No thyroid tenderness. No cervical lymphadenopathy.  CARDIOVASCULAR: S1, S2 regular rate and rhythm. No significant murmurs appreciated.  LUNGS: Bilateral coarse breath sounds and some crackles on the right. Significant wheezing bilaterally as well in all lung fields.  ABDOMEN: Soft, nontender, nondistended. Positive bowel sounds in all quadrants.  EXTREMITIES: No significant lower extremity edema.  NEUROLOGIC: Cranial nerves II through XII grossly intact. Strength is 5 out of 5 in all extremities except for right lower extremity, which was 4+ over 5, but the patient has a positive leg raise on the right.  PSYCHIATRIC: Awake, alert,  oriented x 3.   LABORATORY AND IMAGING: X-ray of  the chest, one view, shows hypoinflation, but mild infiltrate versus atelectasis within the right lung base cannot be excluded. White count is 12.6, hemoglobin 13, platelets 267. INR is 0.9. ABG shows pH of 7.38, pCO2 of 39 and pO2 of 72. Troponin negative. LFTS: Alkaline phosphatase is 231, otherwise within normal limits. BUN 5, creatinine 0.67, sodium 137, potassium 3.9.   EKG: Normal sinus rhythm. Nonspecific ST changes. QT is slightly prolonged at 492 ms, but no acute ST elevations or depressions.   ASSESSMENT AND PLAN: We have a 64 year old with chronic obstructive pulmonary disease and has chronic respiratory failure, ongoing smoking tobacco, coronary artery disease,  hyperlipidemia, who presents with shortness of breath and nonproductive cough for a couple of days, worsening, who came in with acute on chronic respiratory failure with positive systemic inflammatory response syndrome criteria for tachypnea and leukocytosis. The patient has felt somewhat better, but is significantly wheezy and coarse bilaterally and at this point, we will admit her to the hospital. Per criteria, she has acute on chronic respiratory failure and it is  possible she also has sepsis and underlying pneumonia on the right lung base. Would admit her to the hospital. Start her on Levaquin. Obtain blood and sputum cultures and provide her with nebulizers around-the-clock while awake as well as IV steroids. She was counseled for 3 minutes about her tobacco abuse, but she states she is trying and she has cut back from 3 packs. She does not want a nicotine patch at this point. We will monitor her bleeding and also add p.r.n. nebulizers as well. Would hold Advair as she is getting IV steroids. Continue Spiriva. Of note, I will also go ahead and check x-ray of the chest, PA and lateral, tomorrow for better visualization of the right lung base. Would continue her outpatient medications at this point. Start her on heparin for deep vein  thrombosis prophylaxis and some Robitussin for symptomatic cough. In regards to her previous seizures, she states she had 1 seizure and she has been on phenytoin for multiple years without any seizure activity. I would go ahead and check a phenytoin level. She describes having some back issues and she has a positive for right leg raise test. I would obtain lumbar spine x-rays and a physical therapy consult. Of note, the patient has had a couple of doses of Dilaudid here. If she feels some nausea with Dilaudid, then I will start her on some Zofran p.r.n..   CODE STATUS: The patient is full code.   TOTAL TIME SPENT: 65 minutes.   ____________________________ Krystal Eaton, MD sa:aw D: 09/07/2013 14:26:05 ET T: 09/07/2013 15:28:13 ET JOB#: [id number]  cc: Krystal Eaton, MD, <Dictator> Krystal Eaton MD ELECTRONICALLY SIGNED 09/22/2013 13:55

## 2015-04-22 NOTE — Discharge Summary (Signed)
PATIENT NAME:  Lori Cooke, MINERVINI MR#:  [id number] DATE OF BIRTH:  [birth year]  DATE OF ADMISSION:  09/07/2013 DATE OF DISCHARGE:  09/11/2013  DISCHARGE DIAGNOSES:  1. Acute on chronic respiratory failure, using oxygen 2 to 3 liters at baseline.  2. Chronic obstructive pulmonary disease exacerbation.  3. Pneumonia.  4. Dysphagia. Swallow evaluation done, tolerated all types  CONDITION ON DISCHARGE: Stable.   MEDICATIONS ON DISCHARGE:  1. Pantoprazole 40 mg once a day.  2. Prednisone 10 mg. Start 60 mg and taper by 10 mg daily until complete.  3. Tramadol 50 mg oral tablet orally 3 times a day as needed for pain.  4. Phenytoin 200 mg oral capsule every 12 hours.  5. Gabapentin 300 mg oral capsule 2 capsules oral every 8 hours.  6. Crestor 10 mg oral tablet 1 tablet orally once a day at bedtime.  8. Quetiapine 100 mg once a day.  9. Metoprolol 25 mg once a day.  10. Advair 1 puff inhalation 2 times a day.  11. Spiriva 1 capsule inhalation once a day.  12. Diltiazem 180 mg extended release once a day.  13. Amlodipine 10 mg once a day.  14. Ceftin 500 mg oral tablet 2 times a day for 5 days.  15. Azithromycin 500 mg oral tablet once a day for 3 days.   HOME HEALTH ON DISCHARGE: Yes. Physical therapy and nurse aid.   OXYGEN ON DISCHARGE: Yes, 2 liters nasal cannula.   DIET ON DISCHARGE: Low sodium. Diet consistency regular.   ACTIVITY: As tolerated.   TIMEFRAME TO FOLLOWUP: Within 1 to 2 weeks with PMD.   HISTORY OF PRESENT ILLNESS: A 64 year old Caucasian female with history of respiratory failure and COPD and coronary artery disease. Started having 2 days of shortness of breath and nonproductive cough without any fever and chills but persistent chest pain. She was also smoking. She came with shortness of breath to the ER and was given nebulizer treatment evidently with 125 mg of IV Solu-Medrol but did not have significant relief. On x-ray in the err was also found as having possible  right-sided pneumonia so was admitted for COPD exacerbation, respiratory failure and pneumonia.  1. Acute respiratory failure. She was continued on oxygen and was given treatment for COPD and pneumonia. Initially, she was started on IV antibiotics with Levaquin because of her multiple allergic profile and on IV steroid and nebulizer treatment. She did not have significant or satisfactory improvement for initial 3 days, so we decided to give her ceftriaxone and azithromycin as she had allergy to PENICILLIN but which was not clear what exactly happened with that so we gave her the first dose slow and she tolerated it very nicely, so we continued ceftriaxone and erythromycin and after receiving 2 doses of these antibiotics, she felt significantly better. She will be discharged on finishing 7 days of antibiotic therapy at home tapering oral steroid and nebulizer treatments.  2. The patient was also noticed on x-ray as having some interstitial edema. BNP was 680, so we stopped IV fluids and we gave Lasix oral doses and the patient was feeling better.  3. Dysphagia. At patient request, speech and swallow evaluation was done, and they suggested starting changes in dietary habits and the patient was tolerating it nicely.  4. Tobacco abuse. Nicotine replacement therapy initiated and continued with the patient in the hospital.   IMPORTANT LABORATORY RESULTS IN THE HOSPITAL: White cell count on presentation was 13,200, hemoglobin was 12.1 and platelet count  was 241. Creatinine was 0.9. Sodium was 132. Potassium was 4.4. Magnesium was 1.2 which was replaced. Chest x-ray, PA and lateral: Mild increased interstitial marking with smoking history or could reflect very minimal interstitial edema. No focal pneumonia. No definitive pulmonary vascular congestion. Echocardiogram was done which showed ejection fraction 50% to 55%, normal global left ventricular systolic function, decreased internal cavity of left ventricle, aortic  valve normally functioning bioprosthetic valve. Chest x-ray repeat on September 12th showed pleural effusion, increased density in the left lower lobe posteriorly is consistent with pneumonia.   TOTAL TIME SPENT ON THIS DISCHARGE: 45 minutes.  ____________________________ Hope PigeonVaibhavkumar G. Elisabeth PigeonVachhani, MD vgv:gb D: 09/15/2013 22:37:00 ET T: 09/15/2013 23:44:10 ET JOB#: [id number]  cc: Hope PigeonVaibhavkumar G. Elisabeth PigeonVachhani, MD, <Dictator> Altamese DillingVAIBHAVKUMAR Yanice Maqueda MD ELECTRONICALLY SIGNED 09/19/2013 15:02

## 2015-04-22 NOTE — Consult Note (Signed)
PATIENT NAME:  Lori Cooke, KWOLEK MR#:  [id number] DATE OF BIRTH:  10-11-1951  DATE OF CONSULTATION:  11/18/2013  CONSULTING PHYSICIAN:  Audery Amel, MD  IDENTIFYING INFORMATION AND REASON FOR CONSULT: A 64 year old woman with a history of anxiety and depression who presents voluntarily to the Emergency Room.   CHIEF COMPLAINT: "My nerves are really bad."   HISTORY OF PRESENT ILLNESS: Information obtained from patient and the chart. The patient states for the last 2 weeks or so, her anxiety level has been very high. She has difficulty sleeping at night. She feels nervous and anxious all the time. She has trouble thinking clearly because of it. She feels afraid of being in her house when she is alone. She does not report any delusions. She does not have any paranoia. Does not have any bizarre thoughts about it. She also denies any hallucinations. She also denies any thoughts of killing herself or wanting to die at all. Denies any homicidal ideation.   She has talked to her primary care doctor about this and he gave her a low dose of Xanax to take. She has been taking the 0.25 mg twice a day and does not think it helps. He also started her on Seroquel 100 mg at night 2 days ago. So far she finds it helps her sleep better.   The patient is alert and oriented and able to make reasonable decisions and take care of herself. She is seeking treatment for her anxiety.   PAST PSYCHIATRIC HISTORY: Denies past suicide attempts. She says she has had a prior psychiatric hospitalization many years ago. More recently, she has not been getting any outpatient psychiatric treatment except from her primary care doctor. She says she has chronic anxiety but has never felt as bad as this. No history of psychotic disorders. She denies any history of substance abuse problems. She has been identified as potentially misusing narcotics in the past, although I think the final judgment on that is unclear.   PAST MEDICAL HISTORY:  Has chronic pain, generalized, going down her right side. She says she has been told that it is probably in her neck, but the work-up has not been completed. Additionally, she had COPD, dyslipidemia, has had a heart valve replaced with a porcine heart valve.   CURRENT MEDICATIONS: Gabapentin 300 mg one to 3 of them one to 4 times a day as needed. Magnesium 400 mg once a day. Ventolin 2 puffs four times a day as needed, levofloxacin only for five days, 500 mg once a day, Advair Diskus 250 mcg strength twice a day, Crestor 10 mg at night, diltiazem 180 mg once a day, pantoprazole 40 mg once a day, quetiapine 100 mg at night, Plavix 75 mg a day, metoprolol 25 mg once a day and Xanax 0.25 mg twice a day.   ALLERGIES: MORPHINE, PENICILLIN AND SULFA DRUGS.   FAMILY HISTORY: Positive for depression.   SOCIAL HISTORY: The patient currently living by herself. This is a major issue for her. She moved here from [facility] within the last year to be closer to her daughter, and now her daughter has taken off and moved somewhere else, leaving the patient by herself. This makes the patient extremely anxious and upset. She feels very isolated.   MENTAL STATUS EXAMINATION: Disheveled woman, looks older than her stated age, cooperative with the interview. Good eye contact. Normal psychomotor activity. Speech normal rate, tone and volume. Affect modestly anxious, reactive, not out of control. Mood stated as nervous.  Thoughts appear to be lucid. She subjectively feels like her thoughts are racing, but she is able to stay on topic and make sense in conversation. No sign of delusional thinking. Denies auditory or hallucinations. Denies any suicidal or homicidal ideation. Shows at least partial judgment and insight, probably normal intelligence. Alert and oriented x4.   LABORATORY RESULTS: Drug screen is positive for tricyclics and benzodiazepines. The tricyclics are probably a reflection of the Seroquel. Alcohol level negative  and potassium slightly low at 3.4. Alkaline phosphatase very slightly up at 186. No other significant abnormalities. Hematology panel normal. Urinalysis unremarkable.   ASSESSMENT: A 64 year old woman with current severe anxiety but no psychosis, no suicidality, able to take care of herself. The patient is also on chronic home oxygen which would prevent us from admitting her to the inpatient psychiatric ward.   Right now, she does not need inpatient psychiatric treatment. The patient has been counseled about the importance and availability of getting psychiatric treatment in the community. She has been referred to [facility] for follow-up psychiatric treatment in the community. She agrees to go.   DIAGNOSIS, PRINCIPAL AND PRIMARY:  AXIS I: Anxiety disorder, not otherwise specified.   SECONDARY DIAGNOSES:  AXIS I: Rule out major depression.  AXIS II: Deferred.  AXIS III: Chronic obstructive pulmonary disease, hypertension, history of porcine heart valve, dyslipidemia.  AXIS IV: Moderate to severe from isolation.  AXIS V: Functioning at time of evaluation, 50.  ____________________________ Audery AmelJohn T. Nychelle Cassata, MD jtc:np D: 11/18/2013 17:04:03 ET T: 11/18/2013 18:01:40 ET JOB#: [id number]  cc: Audery AmelJohn T. Laketra Bowdish, MD, <Dictator> Audery AmelJOHN T Mayan Kloepfer MD ELECTRONICALLY SIGNED 11/18/2013 19:22

## 2015-04-22 NOTE — Consult Note (Signed)
PATIENT NAME:  Lori Cooke, Lori Cooke MR#:  [id number] DATE OF BIRTH:  08-27-1951  DATE OF CONSULTATION:  12/13/2013  REFERRING PHYSICIAN:   CONSULTING PHYSICIAN:  Marcina Millard, MD  PRIMARY CARE PHYSICIAN:  Durward Mallard. Marguerite Olea, MD  CHIEF COMPLAINT: Shortness of breath.   REASON FOR CONSULTATION: Consultation is requested for evaluation of diastolic congestive heart failure, aortic stenosis and coronary artery disease.   HISTORY OF PRESENT ILLNESS: The patient is a 64 year old female with known history of coronary artery disease status post bypass graft surgery and aortic valve replacement with porcine valve 8 years ago in [place]. The patient has known history of COPD and continues to smoke. She has been hospitalized several times for COPD exacerbation. She was admitted on 12/07/2013 after a 2 to 3-day history of increasing shortness of breath, wheezing and productive cough. The patient is being treated with intravenous steroids and nebulizer therapy. The patient denies chest pain. Echocardiogram reveals normal left ventricular function with a calculated aortic valve area of 0.86 sq cm with a mean gradient of only 11.5 mmHg.    PAST MEDICAL HISTORY: 1.  Status post coronary artery bypass graft surgery and aortic valve replacement with a porcine valve approximately 8 years ago in [place].  2.  Hypertension.  3.  Hyperlipidemia.  4.  Diabetes.  5.  COPD.  6.  History of COPD exacerbations.  7.  History of aortic aneurysm.  8.  Peripheral neuropathy.  9.  Seizure disorder.  10.  History of left carotid stenosis.   HOME MEDICATIONS: Clopidogrel 75 mg daily, Lexapro 10 mg daily.   SOCIAL HISTORY: The patient has a 50 pack-year tobacco abuse history, still smoking less than 1/2 pack of cigarettes a day.   FAMILY HISTORY: No immediate family history for coronary artery disease or myocardial infarction.   REVIEW OF SYSTEMS: CONSTITUTIONAL: No fever or chills.  EYES: No blurry vision.  EARS:  No hearing loss.  RESPIRATORY: The patient has shortness of breath, productive cough with wheezing.  CARDIOVASCULAR: No chest pain.  GASTROINTESTINAL: No nausea, vomiting, diarrhea.  GENITOURINARY: No dysuria or hematuria.  ENDOCRINE: No polyuria or polydipsia.  MUSCULOSKELETAL: No arthralgias or myalgias.  NEUROLOGICAL: No focal muscle weakness or numbness.  PSYCHOLOGICAL: No depression or anxiety.   PHYSICAL EXAMINATION: VITAL SIGNS: Blood pressure 152/84, pulse 74, temperature 98.2, pulse ox 91%, respirations 20.  HEENT: Pupils equal, reactive to light and accommodation.  NECK: Supple without thyromegaly.  LUNGS: Revealed decreased breath sounds in both bases.  HEART: Normal JVP. Normal PMI. Regular rate and rhythm. Normal S1, S2. Grade 1/6 systolic murmur.  ABDOMEN: Soft and nontender without hepatosplenomegaly.  EXTREMITIES: No cyanosis, clubbing or edema. Pulses were intact bilaterally.  MUSCULOSKELETAL: Normal muscle tone.  NEUROLOGIC: The patient is alert and oriented x 3. Motor and sensory both grossly intact.   IMPRESSION: A 64 year old female with known coronary artery disease as well as aortic valve replacement, who is admitted with chronic obstructive pulmonary disease exacerbation slowly improving. The patient does have some mild diastolic congestive heart failure. Echocardiogram reveals normal left ventricular function with, at most, moderate aortic stenosis with a mild mean gradient.   RECOMMENDATIONS: 1.  Agree with overall current therapy.  2.  Would defer full-dose anticoagulation.  3.  Continue diuresis.  4.  No further cardiac noninvasive or invasive diagnostics at this time.   ____________________________ Marcina Millard, MD ap:cs D: 12/13/2013 14:34:11 ET T: 12/13/2013 15:07:21 ET JOB#: [id number]  cc: Marcina Millard, MD, <Dictator> Marcina Millard MD  ELECTRONICALLY SIGNED 12/18/2013 8:52

## 2015-04-22 NOTE — Consult Note (Signed)
Brief Consult Note: Diagnosis: anxiety disorder nos.   Patient was seen by consultant.   Consult note dictated.   Recommend further assessment or treatment.   Discussed with Attending MD.   Comments: Psychiatry: PAtient seen and chart reviewwed. Complaints of anxiety. Dennies any suicidal ideation. Not psychotic. Advised patient of availibility of treatment for mental health problems and refer her to outpt treatment in community. See full note.  Electronic Signatures: Audery Amellapacs, Velisa Regnier T (MD)  (Signed [phone]-Nov-14 16:56)  Authored: Brief Consult Note   Last Updated: 19-Nov-14 16:56 by Audery Amellapacs, Rafael Quesada T (MD)

## 2015-04-23 NOTE — H&P (Signed)
PATIENT NAME:  Lori Cooke, Lori Cooke MR#:  [id number] DATE OF BIRTH:  04-15-1951  DATE OF ADMISSION:  01/05/2014  PRIMARY CARE PHYSICIAN: Dr. Marguerite OleaMoffett.   PRIMARY CARDIOLOGIST: Dr. Juliann Paresallwood.   REFERRING EMERGENCY ROOM PHYSICIAN: Dr. Sharyn CreamerMark Quale.   CHIEF COMPLAINT: Shortness of breath.   HISTORY OF PRESENTING ILLNESS: A 64 year old female who has past medical history of hypertension and aortic valve replacement surgery, porcelain valve, chronic respiratory failure  on 2 liters at home, COPD, chronic smoker, aortic aneurysm, CAD status post CABG, hyperlipidemia, who was admitted to the hospital for acute on chronic respiratory failure and COPD exacerbation. She was, last month, in the hospital for this complaint; discharged home, but the patient says that she never felt better when in the hospital and after went home, and continued feeling worse short of breath. She continued having that problem since then, and last week, the problem is much worse, and she is now feeling very weak and short of breath. She also fell down 4 times in the last 1 week, and she had pain in her right upper arm which, in the Emergency Room, found having a fracture in the forearm, nondisplaced. In the Emergency Room, the workup was done, which reported elevated BNP and elevated troponin with some EKG changes, which is new. She also had complaint of some chest pain when she came in the Emergency Room, which is resolved now, so hospitalist service is contacted for further management of CHF and elevated troponin with shortness of breath.     REVIEW OF SYSTEMS:   CONSTITUTIONAL: Negative for fever, fatigue, weakness, pain or weight loss.  EYES: No blurring, double vision, discharge or redness.  EARS, NOSE, THROAT: No tinnitus, ear pain or hearing loss.  RESPIRATORY: The patient denies any cough but has wheezing and shortness of breath.  CARDIOVASCULAR: Has some chest pain, but no orthopnea, edema, arrhythmia or palpitations.   GASTROINTESTINAL: No nausea, vomiting, diarrhea or abdominal pain.  GENITOURINARY: No dysuria, hematuria or increased frequency.  ENDOCRINE: No heat or cold intolerance. No increased sweating.  SKIN: No acne, rashes or lesions on the skin.  MUSCULOSKELETAL: No pain or swelling in the joints.  NEUROLOGICAL: No numbness, weakness, tremor or vertigo.  PSYCHIATRIC: No anxiety, insomnia, bipolar disorder.   PAST MEDICAL HISTORY:  1.  Hypertension.  2.  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  3.  Chronic respiratory failure, 2 liters of oxygen at home.  4.  History of aortic aneurysm.  5.  Coronary artery disease, status post CABG.  6.  Hyperlipidemia.  7.  Neuropathy.  8.  History of diabetes, not on medications.  9.  Anxiety.  10.  Depression.  11.  History of seizure disorder.  12.  History of left-sided carotid stenosis.   PAST SURGICAL HISTORY:  1.  Hysterectomy.  2.  CABG.  3.  Aortic valve replacement.  4.  Gallbladder surgery.  5.  Goiter surgery.   FAMILY HISTORY: Diabetes, hypertension, depression.    SOCIAL HISTORY: Smoker for more than 50 years. She is trying to cut down. Currently smoking 5 cigarettes every day. Denies any alcohol or drug use. Lives alone and sometimes walks with a walker.   HOME MEDICATIONS: 1.  Ventolin 2 puff inhalation 4 times a day.  2.  Spiriva 18 mcg 1 capsule once a day.  3.  Theophylline 100 mg oral tablet extended-release 2 times a day.  4.  Quetiapine 100 mg oral tablet once a day.  5.  Polyethylene glycol 17  grams oral once a day.  6.  Plavix 75 mg once a day.  7.  Pantoprazole 40 mg once a day.  8.  Metoprolol 25 mg extended-release once a day.  9.  Magnesium oxide 400 mg 1 to 2 times a day.  10.  Lexapro 10 mg oral tablet once a day. 12.  Furosemide 20 mg oral once a day.  13.  Cardizem 180 mg 24-hour extended release once a day.  14.  Crestor 10 mg once a day.  15.  Advair Diskus 1 puff inhalation 2 times a day.    PHYSICAL EXAMINATION:  VITAL SIGNS:  In ER, temperature 98, pulse 95, respirations 24, blood pressure 140/68 and pulse ox 95% on 2 liters oxygen.  GENERAL: The patient is alert and oriented to time, place and person. Does not appear in any acute distress.  HEENT: Head and neck atraumatic. Conjunctivae pink. Oral mucosa moist.  NECK: Supple. No JVD.  RESPIRATORY: Bilateral equal air entry. There is wheezing and crepitation present bilaterally.  CARDIOVASCULAR: S1, S2 present. Regular. Murmur present.  ABDOMEN: Soft, nontender. Bowel sounds present. No organomegaly.  SKIN: No rashes.  EXTREMITIES:  Legs: No edema.  NEUROLOGICAL: Power 5/5. Moves all 4 limbs. Follows commands, except right upper limb, there is some pain and cast present.  MUSCULOSKELETAL:  Joints: No swelling or tenderness. On right upper limb, there is immobilization cast present.    PSYCHIATRIC: There does not appear to be any acute psychiatric illness at this time.   IMPORTANT LABORATORY RESULTS:  Glucose 134. BNP is 9554. BUN 10, creatinine 1.06, sodium 135 potassium 2.3, chloride 29, CO2 of 32. Troponin 0.27. WBC 15,000, hemoglobin 11.6, platelet count 166. Chest x-ray, portable single view, shows mildly enlarged pericardial silhouette equal, prosthetic valve with ectatic ascending thoracic aorta, and forearm, right side, shows fracture of the distal radius, mild, likely extending into the distal radioulnar joint and potential distal radial articular surface.   ASSESSMENT AND PLAN: A 64 year old female with past medical history of hypertension, coronary artery disease, congestive heart failure, chronic obstructive pulmonary disease and valvular replacement surgery, came after having multiple falls and short of breath for the last 1 week and had some chest pain also. Today, while here in the ER, BNP is elevated, as well as  troponin, and EKG shows some new changes in V2 and V3 T wave inversion.  1.  Congestive heart failure  exacerbation. We will give IV Lasix and we will do echocardiogram to evaluate further cardiac function at this time with this acute exacerbation and cardiology consult.  2.  Non-ST-elevation myocardial infarction. Monitor on telemetry and troponin serially. We will  give Lovenox subcutaneous b.i.d. and cardiology consult for further management. Continue aspirin and beta blockers.  3.  Chronic obstructive pulmonary disease exacerbation, wheezing is present. We will give steroid and nebulizer treatment.  Continue Spiriva and Advair as she is taking at home.  4.  Right forearm fracture. Cast is done by ER physician. We will consult ortho for further management and do pain management.  5.  Tobacco abuse. Counseling is done for 5 minutes, and she agreed to try a nicotine patch in the hospital.   Total time spent on this admission is 50 minutes.    ____________________________ Hope Pigeon Elisabeth Pigeon, MD vgv:dmm D: 01/05/2014 19:52:45 ET T: 01/05/2014 20:09:49 ET JOB#: [id number]  cc: Hope Pigeon. Elisabeth Pigeon, MD, <Dictator> Altamese Dilling MD ELECTRONICALLY SIGNED 01/06/2014 22:23

## 2015-04-23 NOTE — Consult Note (Signed)
Chief Complaint:  Subjective/Chief Complaint States to be about the same with breathing getting a little bettter.   Brief Assessment:  GEN well developed, well nourished, no acute distress   Cardiac Regular  murmur present  -- LE edema  -- JVD  --Gallop   Respiratory normal resp effort  clear BS  rhonchi   Gastrointestinal Normal   Gastrointestinal details normal Soft  Nontender  Nondistended  No masses palpable  Bowel sounds normal   EXTR negative cyanosis/clubbing, negative edema   Lab Results: General Ref:  10-Jan-15 09:13   Immunoelectrophoresis Random Urine ========== TEST NAME ==========  ========= RESULTS =========  = REFERENCE RANGE =  URINE IEP, RANDOM  IFE and PE, Random Urine Protein,Total,Urine             [H  16.1 mg/dL           ]          1.6-10.9 Albumin, U                      [   22.0%               ]                   Alpha-1-Globulin, U             [   6.0 %                ]                   Alpha-2-Globulin, U             [   8.0 %                ]                   Beta Globulin, U                [   55.9 %               ]                   Gamma Globulin, U               [   8.0 %                ]                   M-Spike, %                      [   Not Observed %       ]      Not Observed Immunofixation Result, Urine    [   Final Report         ]  An apparent normal immunofixation pattern.   Note:                         [   Final Report         ]                   Protein electrophoresis scan will follow via computer, mail, or courier delivery.               [facility]    No: [id number]  [address] [place], [place] [address]           Mila Homer, MD         [phone]   Result(s) reported on 11 Jan 2014 at 04:51PM.  Routine Hem:  10-Jan-15 04:31   WBC (CBC)  13.5  RBC (CBC)  2.93  Hemoglobin (CBC)  8.5  Hematocrit (CBC)  25.2  Platelet Count (CBC)  121 (Result(s) reported on 09 Jan 2014 at 07:28AM.)  MCV  86  MCH 28.9  MCHC 33.6  RDW 14.5  Segmented Neutrophils 73  Lymphocytes 16  Monocytes 7  Diff Comment 1 PLTS VARIED IN SIZE  Diff Comment 2 RBCs APPEAR NORMAL  Result(s) reported on 09 Jan 2014 at 07:28AM.  Bands 3  Metamyelocyte 1  Retic Count 1.40  Absolute Retic Count 0.0410 (Result(s) reported on 09 Jan 2014 at [facility].)   Radiology Results: XRay:    06-Jan-15 16:41, Chest Portable Single View  Chest Portable Single View   REASON FOR EXAM:    shortness of breath; chest pain  COMMENTS:       PROCEDURE: DXR - DXR PORTABLE CHEST SINGLE VIEW  - Jan 05 2014  4:41PM     CLINICAL DATA:  Shortness of breath.  Chest pain.  Fall.    EXAM:  PORTABLE CHEST - 1 VIEW    COMPARISON:DG CHEST 2V dated 12/13/2013; DG CHEST 2V dated  12/11/2013; DG CHEST 2V dated 12/07/2013; CT CHEST W/ CM dated  11/22/2008    FINDINGS:  Mild cardiomegaly with cardiothoracic index of 58%. Mildly tortuous  thoracic aorta. Prior median sternotomy.    The lungs appear clear. No pneumothorax. No pleural effusion  observed. 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     IMPRESSION:  1. Mildly enlarged cardiopericardial silhouette.  2. 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      Electronically Signed    By: Herbie Baltimore M.D.    On: 01/05/2014 16:52     Verified By: Dellia Cloud, M.D.,    06-Jan-15 16:41, Forearm Right  Forearm Right   REASON FOR EXAM:    s/p fall; edema/pain  COMMENTS:       PROCEDURE: DXR - DXR FOREARM RIGHT  - Jan 05 2014  4:41PM     CLINICAL DATA:  Fall.  Forearm edema and pain.    EXAM:  RIGHT FOREARM - 2 VIEW    COMPARISON:  DG HAND COMPLETE 3+V*R* dated 01/05/2014    FINDINGS:  There is a fracture of the distal radius medially involving the  distal metaphysis and believed to be extending into the distal  radioulnar joint and possibly the distal radial articular surface  laterally.    I do not observe an ulnar  fracture.  No elbow joint effusion.     IMPRESSION:  1. Fracture of the distal radius medially, likely extending into the  distal radioulnar joint and potentially the distal radial articular  surface.      Electronically Signed    By: Herbie Baltimore M.D.    On: 01/05/2014 17:00     Verified By: Dellia Cloud, M.D.,    06-Jan-15 16:41, Hand Right Complete  Hand Right Complete   REASON FOR EXAM:    s/p fall; edema/pain  COMMENTS:       PROCEDURE: DXR - DXR HAND RT COMPLETE W/OBLIQUES  - Jan 05 2014  4:41PM     CLINICAL DATA:  Fall.  Edema and  pain in the hand.    EXAM:  RIGHT HAND - COMPLETE 3+ VIEW    COMPARISON:  None.    FINDINGS:  Subtle distal radial fracture is just about occult on these  radiographs, but is better shown on the forearm radiographs obtained  concurrently. No additional fracture is observed.     IMPRESSION:  1. Distal radial fracture medially, with suspected extension into  the distal radial ulnar joint and possibly the distal radial  articular surface. This fracture is nearly occult on this exam, but  is better shown on the dedicated forearm radiographs.      Electronically Signed    By: Herbie BaltimoreWalt  Liebkemann M.D.    On: 01/05/2014 16:54         Verified By: Dellia CloudWALTER D. LIEBKEMANN, M.D.,    07-Jan-15 13:31, Chest PA and Lateral  Chest PA and Lateral   REASON FOR EXAM:    chf  COMMENTS:       PROCEDURE: DXR - DXR CHEST PA (OR AP) AND LATERAL  - Jan 06 2014  1:31PM     CLINICAL DATA:  History of CHF now with dyspnea    EXAM:  CHEST  2 VIEW    COMPARISON:  Portable chest x-ray January 05, 2014    FINDINGS:  The lungs are well-expanded. The interstitial markings are somewhat  coarse. The central pulmonary vascularity is prominent. On the  lateral film there are coarse lung markings in the retrocardiac  region likely bilaterally. A prominent crush-type prosthetic valve  in the aortic position is demonstrated. There is no  significant  pleural effusion. The patient has undergone previous kyphoplasty for  a mid upper vertebral body compression. The patient has 5 intact  sternal wires demonstrated.     IMPRESSION:  The findings may reflect low-grade interstitial edema of cardiac  cause. Overall there has not been dramatic interval change since the  previous study. Atelectasis or developing pneumonia in the lower  lobes on the lateral film cannotbe excluded.      Electronically Signed    By: David  SwazilandJordan    On: 01/06/2014 13:57         Verified By: DAVID A. SwazilandJORDAN, M.D., MD    09-Jan-15 19:22, Knee Right Complete  Knee Right Complete   REASON FOR EXAM:    s/p fall and patient c/o right knee pain  COMMENTS:       PROCEDURE: DXR - DXR KNEE RT COMP WITH OBLIQUES  - Jan 08 2014  7:22PM     CLINICAL DATA:  Fall with right knee pain.    EXAM:  RIGHT KNEE - COMPLETE 4+ VIEW    COMPARISON: None    FINDINGS:  There is no evidence of acute fracture, subluxation or dislocation.  There is no evidence of joint effusion.    Mild sclerotic irregularity of the proximal tibia may represent an  infarct.    No other focal bony abnormalities are present.    The joint spaces are otherwise unremarkable.     IMPRESSION:  No evidence of acute abnormality.      Electronically Signed    By: Laveda AbbeJeff  Hu M.D.    On: 01/08/2014 19:37         Verified By: Rosendo GrosJEFFREY T. HU, M.D.,    10-Jan-15 15:47, Chest 1 View AP or PA  Chest 1 View AP or PA   REASON FOR EXAM:    verify PICC placement correct  COMMENTS:  PROCEDURE: DXR - DXR CHEST 1 VIEWAP OR PA  - Jan 09 2014  3:47PM     CLINICAL DATA:  PICC line placement    EXAM:  CHEST - 1 VIEW    COMPARISON:  01/06/2014    FINDINGS:  Cardiomediastinal silhouette is stable. Mild interstitial prominence  bilaterally again noted. There is left arm PICC line with tip in SVC  right atrium junction. No diagnostic pneumothorax. No segmental  infiltrate.      IMPRESSION:  Left arm PICC line with tip in SVC right atrium junction. No  diagnostic pneumothorax.      Electronically Signed    By: Natasha Mead M.D.    On: 01/09/2014 15:58         Verified By: Kennieth Francois, M.D.,  Cardiology:    06-Jan-15 16:18, ED ECG  Ventricular Rate 93  Atrial Rate 93  P-R Interval 138  QRS Duration 84  QT 360  QTc 447  P Axis 71  R Axis 60  T Axis 69  ECG interpretation   Normal sinus rhythm  Possible Left atrial enlargement  ST & T wave abnormality, consider anterior ischemia  Abnormal ECG  When compared with ECG of 07-Dec-2013 10:47,  Non-specific change in ST segment in Inferior leads  T wave inversion now evident in Anterior leads  ----------unconfirmed----------  Confirmed by OVERREAD, NOT ([id number]), editor PEARSON, BARBARA ([id number]) on 01/08/2014 8:39:01 AM  ED ECG     07-Jan-15 09:42, Echo Doppler  Echo Doppler   REASON FOR EXAM:      COMMENTS:       PROCEDURE: [facility] - ECHO DOPPLER COMPLETE(TRANSTHOR)  - Jan 06 2014  9:42AM     RESULT: Echocardiogram Report    Patient Name:   Lori Cooke Date of Exam: 01/06/2014  Medical Rec #:  [id number]       Custom1:  Date of Birth:  1951-12-14    Height:       65.0 in  Patient Age:    64 years     Weight:       187.4 lb  Patient Gender: F            BSA:          1.92 m??    Indications: CHF  Sonographer:    Cristela Blue RDCS  Referring Phys: Altamese Dilling    Sonographer Comments: Echo stopped after apicals-per pt request- felt   like she was going to vomit.    Summary:   1. Left ventricular ejection fraction, by visual estimation, is 60 to   65%.   2. Normal global left ventricular systolic function.   3. Mildly dilated left atrium.   4. Mild mitral valve regurgitation.   5. Moderate thickening and calcification of the anterior and posterior   mitral valve leaflets.   6. Myxomatous mitral valve.   7. Mildly elevated pulmonary artery systolic pressure.   8. Mild to moderate tricuspid  regurgitation.   9. Moderately increased left ventricular posterior wall thickness.  2D AND M-MODE MEASUREMENTS (normal ranges within parentheses):  Left Ventricle:          Normal  IVSd (2D):      1.26 cm (0.7-1.1)  LVPWd (2D):     1.40 cm (0.7-1.1) Aorta/LA:                  Normal  LVIDd (2D):     4.51 cm (3.4-5.7) Aortic Root (2D): 3.50 cm (2.4-3.7)  LVIDs (2D):     2.75 cm           Left Atrium (2D): 4.20 cm (1.9-4.0)  LV FS (2D):     39.0 %   (>25%)  LV EF (2D):     69.6 %   (>50%)                                    Right Ventricle:                                    RVd (2D):        2.45 cm  LV DIASTOLIC FUNCTION:  MV Peak E: 0.90 m/s E/e' Ratio: 12.40  MV Peak A: 1.18 m/s Decel Time: 335 msec  E/A Ratio: 0.77  SPECTRAL DOPPLER ANALYSIS (where applicable):  Mitral Valve:  MV P1/2 Time: 97.15 msec  MV Area, PHT: 2.26 cm??  Aortic Valve: AoV Max Vel: 2.80 m/s AoV Peak PG: 31.4 mmHg AoV Mean PG:   19.0 mmHg  LVOT Vmax: 0.90 m/s LVOT VTI: 0.232 m LVOT Diameter: 2.10 cm  AoV Area, Vmax: 1.11 cm?? AoV Area, VTI: 1.43 cm?? AoV Area, Vmn: 1.02 cm??  Tricuspid Valve and PA/RV Systolic Pressure: TR Max Velocity: 3.03 m/s RA   Pressure: 5 mmHg RVSP/PASP: 41.7 mmHg  Pulmonic Valve:  PV Max Velocity: 1.22 m/s PV Max PG: 6.0 mmHg PV Mean PG:    PHYSICIAN INTERPRETATION:  Left Ventricle: The left ventricular internal cavity size was normal. LV   septal wall thickness was normal. LV posterior wall thickness was   moderately increased. Global LV systolic function was normal. Left   ventricular ejection fraction, by visual estimation, is 60 to 65%.  Right Ventricle: The right ventricular size is normal. Global RV systolic   function is normal.  Left Atrium: The left atrium is mildly dilated.  Right Atrium: The right atrium is normal in size.  Pericardium: There is no evidence of pericardial effusion.  Mitral Valve: The mitral valve is myxomatous. There is moderate   thickening and  calcification of the anterior and posterior mitral valve   leaflets. Mild mitral valve regurgitation is seen. Can not r/o veg vs Ca#.  Tricuspid Valve: The tricuspid valve is normal. Mild to moderate   tricuspid regurgitation is visualized. The tricuspid regurgitant velocity   is 3.03 m/s, and with an assumed right atrial pressure of 5 mmHg, the     estimated right ventricular systolic pressure is mildly elevated at 41.7   mmHg.  Aortic Valve: The aortic valve is normal. No evidence of aortic valve   regurgitation is seen.  Pulmonic Valve: The pulmonic valve is normal.    [id number] Dwayne Callwood MD  Electronically signed by [id number] Dorothyann Peng MD  Signature Date/Time: 01/06/2014/11:14:43 AM    *** Final ***    IMPRESSION: .      Verified By: Alwyn Pea, M.D., MD    13-Jan-15 08:10, TEE  TEE   REASON FOR EXAM:      COMMENTS:       PROCEDURE: [facility] - ECHO TRANSESOPHAGEAL  - Jan 12 2014  8:10AM     RESULT: Transesophageal Echocardiogram Report    Patient Name:   Lori Cooke Date of Exam: 01/12/2014  Medical Rec #:  [id number]       Custom1:  Date  of Birth:  [birth year]    Height:  Patient Age:    48 years     Weight:  Patient Gender: F            BSA:    Indications: Endocarditis  Sonographer:    Cristela Blue RDCS  Referring Phys: Altamese Dilling  SPECTRAL DOPPLER ANALYSIS (where applicable):    PROCEDURE: After discussion of the risks and benefits of the TEE, an   informed consent was obtained. Local oropharyngeal anesthetic was   provided with Benzocaine spray and viscous lidocaine. Intravenous   conscious sedation was provided with 6mg  Midazolam and 125 mcg Fentanyl.   The TEE probe was passed without difficulty. Imaged were obtained with   the patient in a left lateral decubitus position. Image quality was   excellent. The patient's vital signs; including heart rate, blood   pressure, and oxygen saturation; remained stable throughout the   procedure. The patient  tolerated the procedure well and without   complications.    PHYSICIAN INTERPRETATION:  Left Ventricle: LV septal wall thickness was normal. LV posterior wall   thickness was normal. Global LV systolic function was normal. Left   ventricular ejection fraction, by visual estimation, is 60 to 65%.  Left Atrium: The left atrium is normal in size.  Right Atrium: The right atrium is normal in size.  Mitral Valve: A pedunculated and mobile and moderate vegetation is seen   on the anterior mitral leaflet. The MV vegetation measures 1 mm x 1 mm.   Mild mitral valve regurgitation is seen. The MR jet is centrally-directed.  Aortic Valve: Echo findings are consistent with vegetation of the aortic   prosthesis.  Shunts: Agitated saline contrast was given intravenously to evaluate for   intracardiac shunting. Saline contrast bubble study was negative, with no   evidence of any intracardiac shunt.    Summary:   1. Left ventricular ejection fraction, by visual estimation, is 60 to   65%.   2. Normal global left ventricular systolic function.   3. Mild mitral valve regurgitation.   4. Pedunculated and mobile and moderate vegetation on the mitral valve.   5. Abnormalvegetation of the aortic prosthesis.   6. 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   7. Saline contrast bubble study was negative, with no evidence of any   intracardiac shunt.  [id number] Harold Hedge MD  Electronically signed by [id number] Harold Hedge MD  Signature Date/Time: 01/12/2014/4:52:43 PM  *** Final ***    IMPRESSION: .    Verified By: Dalia Heading, M.D., MD  CT:    08-Jan-15 15:40, CT Abdomen and Pelvis Without Contrast  CT Abdomen and Pelvis Without Contrast   REASON FOR EXAM:    (1) drop in hgb and lower abd pain. eval for intraabd   bleed; (2) drop in hgb and  COMMENTS:       PROCEDURE: CT  - CT ABDOMEN AND PELVIS W0  - Jan 07 2014  3:40PM     CLINICAL DATA:  On set of lower abdominal discomfort today, drop in  hemoglobin  level    EXAM:  CT ABDOMEN AND PELVIS WITHOUT CONTRAST    TECHNIQUE:  Multidetector CT imaging of the abdomen and pelvis was performed  following the standard protocol without intravenous contrast.  COMPARISON:  None.    FINDINGS:  The liver exhibits no focal mass nor ductal dilation. The  gallbladder is surgically absent. The spleen exhibits normal density  and contour and does not exceed  11.2 cm in greatest dimension. The  pancreas, adrenal glands, and kidneys exhibit no acute  abnormalities. The left kidney is atrophic and exhibits several 1-2  mm diameter nonobstructing upper pole stones. There is a 2 mm  diameter midpole nonobstructing stone on the right. There is a small  amount of increased density in the perinephric fat on the right  without evidence of a perinephric hematoma. There is no free fluid  in the abdomen or pelvis. The psoas musculature is normal in density  and contour with no evidence of a hematoma.  The abdominal aorta exhibits failure to taper of its caliber. There  is mural calcification but no evidence of an aneurysm. The patient  has undergone stent placement in the proximal common iliac arteries  bilaterally. There is no evidence of periaortic or peri-iliac  hemorrhage.    The stomach is nondistended. The non-opacified loops of small and  large bowel exhibit no evidence of ileus nor of obstruction. There  is sigmoid diverticulosis without evidence of acute diverticulitis.  The partially distended urinary bladder is normal in appearance.The  uterus is surgically absent. No adnexal masses are demonstrated.  There is no inguinal nor significant umbilical hernia.    The lung bases exhibit mild compressive atelectasis posteriorly with  small amounts of pleural thickening or fluid posteriorly. The lumbar  vertebral bodies are preserved in height. There is disc space  narrowing at L4-5 with posterior hard disc bulging. The bony pelvis  exhibits no evidence  of an acute fracture nor lytic or blastic  lesion. There is mild degenerative change of the right hip.    There is no evidence of a subcutaneous or deeper hematoma. Minimally  increased density in the subcutaneous fat over the hips bilaterally  is nonspecific.     IMPRESSION:  1. There is no evidence of an intraperitoneal or retroperitoneal  hemorrhage. No significant abnormality within the subcutaneous or  deeper soft tissues over the abdomen or pelvis is demonstrated.  2. There is no evidence of acute hepatobiliary abnormality.  3. There are chronic changes within the kidneys with left renal  atrophy and bilateral nonobstructing stones. Mildly increased  density in the perinephric fat on the right is nonspecific. There is  no evidence of obstruction.  4. There is diverticulosis of the sigmoid colon but there is no  evidence of bowel obstruction or ileus.  5. There is mild atelectasis and minimal pleural thickening/fluid in  the posterior costophrenic gutters bilaterally.      Electronically Signed    By: David  Swaziland    On: 01/07/2014 15:56         Verified By: DAVID A. [place], M.D., MD   Assessment/Plan:  Invasive Device Daily Assessment of Necessity:  Does the patient currently have any of the following indwelling devices? 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   Plan PLAN TEE to evaluate for SBE Antbx for pul infection Agree with transfusion as necessary D/C Heparin Hold ASA for now Agree with transfuse 8/24 Agree with Antibx IV Doubt MI Increase activity Inhalers for COPD 02  supplemental Consider GI w/u Proceed with TEE in am   Electronic Signatures: Alwyn Pea (MD)  (Signed 15-Jan-15 00:35)  Authored: Chief Complaint, Brief Assessment, Lab Results, Radiology Results, Assessment/Plan   Last Updated: 15-Jan-15 00:35 by Dorothyann Peng D (MD)

## 2015-04-23 NOTE — Consult Note (Signed)
Chief Complaint:  Subjective/Chief Complaint She is feeling reasonably well . S/p transfusion for anemia. Denies cp   VITAL SIGNS/ANCILLARY NOTES: **Vital Signs.:   09-Jan-15 11:02  Vital Signs Type Routine  Temperature Temperature (F) 97.9  Celsius 36.6  Temperature Source oral  Pulse Pulse 72  Respirations Respirations 20  Systolic BP Systolic BP 828  Diastolic BP (mmHg) Diastolic BP (mmHg) 60  Mean BP 77  Pulse Ox % Pulse Ox % 90  Pulse Ox Activity Level  With exertion  Oxygen Delivery 2L  *Intake and Output.:   Shift 09-Jan-15 07:00  Grand Totals Intake:  544 Output:  300    Net:  244 24 Hr.:  -286  Blood Product      In:  294  IV (Secondary)      In:  250  Urine ml     Out:  300  Length of Stay Totals Intake:  4198 Output:  2300    Net:  1898   Brief Assessment:  GEN well developed, well nourished, no acute distress   Cardiac Regular  murmur present  -- LE edema  -- JVD  --Gallop   Respiratory normal resp effort  clear BS  rhonchi   Gastrointestinal Normal   Gastrointestinal details normal Soft  Nontender  Nondistended  No masses palpable  Bowel sounds normal   EXTR negative cyanosis/clubbing, negative edema   Lab Results: Routine BB:  08-Jan-15 18:01   Direct Coombs, Polyspecific Negative (Result(s) reported on 07 Jan 2014 at 08:18PM.)  Crossmatch Unit 1 Transfused  Crossmatch Unit 2 Ready (Result(s) reported on 08 Jan 2014 at 07:24AM.)    18:03   Direct Coombs, Neonatal IgG -  ABO Group + Rh Type A Positive  Antibody Screen NEGATIVE (Result(s) reported on 07 Jan 2014 at 07:06PM.)  Routine Chem:  08-Jan-15 03:01   Glucose, Serum  123  BUN  25  Sodium, Serum  132  Potassium, Serum 3.7  Chloride, Serum 98  CO2, Serum 31  Calcium (Total), Serum  8.2  Anion Gap  3  Osmolality (calc) 270  Creatinine (comp) 1.15  eGFR (African American)  59  eGFR (Non-African American)  51 (eGFR values <2m/min/1.73 m2 may be an indication of chronic kidney  disease (CKD). Calculated eGFR is useful in patients with stable renal function. The eGFR calculation will not be reliable in acutely ill patients when serum creatinine is changing rapidly. It is not useful in  patients on dialysis. The eGFR calculation may not be applicable to patients at the low and high extremes of body sizes, pregnant women, and vegetarians.)    18:03   Result Comment DAT NEO - WRONG ORDER. SPOKE WEdgemere01/07/2014. REFER TO  - [facility] ##[id number] Result(s) reported on 07 Jan 2014 at 07:56PM.  LDH, Serum  258 (Result(s) reported on 07 Jan 2014 at 06:32PM.)  Routine Coag:  08-Jan-15 03:01   Activated PTT (APTT)  95.8 (A HCT value >55% may artifactually increase the APTT. In one study, the increase was an average of 19%. Reference: "Effect on Routine and Special Coagulation Testing Values of Citrate Anticoagulant Adjustment in Patients with High HCT Values." American Journal of Clinical Pathology 2006;126:400-405.)  Routine Hem:  08-Jan-15 03:01   WBC (CBC) 10.4  RBC (CBC)  2.65  Hemoglobin (CBC)  8.1 (Result(s) reported on 07 Jan 2014 at 05:27AM.)  Hematocrit (CBC)  22.9  Platelet Count (CBC)  90 (Result(s) reported on 07 Jan 2014 at 05:27AM.)  MCV 86  MCH 30.6  MCHC 35.5  RDW 14.1  Neutrophil % 85.0  Lymphocyte % 10.1  Monocyte % 4.8  Eosinophil % 0.0  Basophil % 0.1  Neutrophil #  8.9  Lymphocyte # 1.1  Monocyte # 0.5  Eosinophil # 0.0  Basophil # 0.0 (Result(s) reported on 07 Jan 2014 at 05:27AM.)    14:29   WBC (CBC) 10.6  RBC (CBC)  2.52  Hemoglobin (CBC)  7.7  Hematocrit (CBC)  22.1  Platelet Count (CBC)  101  MCV 88  MCH 30.5  MCHC 34.8  RDW 14.1  Neutrophil % 91.8  Lymphocyte % 5.6  Monocyte % 2.4  Eosinophil % 0.0  Basophil % 0.2  Neutrophil #  9.7  Lymphocyte #  0.6  Monocyte # 0.3  Eosinophil # 0.0  Basophil # 0.0 (Result(s) reported on 07 Jan 2014 at 02:52PM.)    18:01   Hemoglobin (CBC)  7.6 (Result(s)  reported on 07 Jan 2014 at 06:32PM.)   Radiology Results: XRay:    06-Jan-15 16:41, Chest Portable Single View  Chest Portable Single View   REASON FOR EXAM:    shortness of breath; chest pain  COMMENTS:       PROCEDURE: DXR - DXR PORTABLE CHEST SINGLE VIEW  - Jan 05 2014  4:41PM     CLINICAL DATA:  Shortness of breath.  Chest pain.  Fall.    EXAM:  PORTABLE CHEST - 1 VIEW    COMPARISON:DG CHEST 2V dated 12/13/2013; DG CHEST 2V dated  12/11/2013; DG CHEST 2V dated 12/07/2013; CT CHEST W/ CM dated  11/22/2008    FINDINGS:  Mild cardiomegaly with cardiothoracic index of 58%. Mildly tortuous  thoracic aorta. Prior median sternotomy.    The lungs appear clear. No pneumothorax. No pleural effusion  observed. 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     IMPRESSION:  1. Mildly enlarged cardiopericardial silhouette.  2. 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      Electronically Signed    By: Sherryl Barters M.D.    On: 01/05/2014 16:52     Verified By: Carron Curie, M.D.,    06-Jan-15 16:41, Forearm Right  Forearm Right   REASON FOR EXAM:    s/p fall; edema/pain  COMMENTS:       PROCEDURE: DXR - DXR FOREARM RIGHT  - Jan 05 2014  4:41PM     CLINICAL DATA:  Fall.  Forearm edema and pain.    EXAM:  RIGHT FOREARM - 2 VIEW    COMPARISON:  DG HAND COMPLETE 3+V*R* dated 01/05/2014    FINDINGS:  There is a fracture of the distal radius medially involving the  distal metaphysis and believed to be extending into the distal  radioulnar joint and possibly the distal radial articular surface  laterally.    I do not observe an ulnar fracture.  No elbow joint effusion.     IMPRESSION:  1. Fracture of the distal radius medially, likely extending into the  distal radioulnar joint and potentially the distal radial articular  surface.      Electronically Signed    By: Sherryl Barters M.D.    On: 01/05/2014 17:00     Verified  By: Carron Curie, M.D.,    06-Jan-15 16:41, Hand Right Complete  Hand Right Complete   REASON FOR EXAM:    s/p fall; edema/pain  COMMENTS:       PROCEDURE: DXR - DXR HAND RT  COMPLETE W/OBLIQUES  - Jan 05 2014  4:41PM     CLINICAL DATA:  Fall.  Edema and pain in the hand.    EXAM:  RIGHT HAND - COMPLETE 3+ VIEW    COMPARISON:  None.    FINDINGS:  Subtle distal radial fracture is just about occult on these  radiographs, but is better shown on the forearm radiographs obtained  concurrently. No additional fracture is observed.     IMPRESSION:  1. Distal radial fracture medially, with suspected extension into  the distal radial ulnar joint and possibly the distal radial  articular surface. This fracture is nearly occult on this exam, but  is better shown on the dedicated forearm radiographs.      Electronically Signed    By: Sherryl Barters M.D.    On: 01/05/2014 16:54         Verified By: Carron Curie, M.D.,    07-Jan-15 13:31, Chest PA and Lateral  Chest PA and Lateral   REASON FOR EXAM:    chf  COMMENTS:       PROCEDURE: DXR - DXR CHEST PA (OR AP) AND LATERAL  - Jan 06 2014  1:31PM     CLINICAL DATA:  History of CHF now with dyspnea    EXAM:  CHEST  2 VIEW    COMPARISON:  Portable chest x-ray January 05, 2014    FINDINGS:  The lungs are well-expanded. The interstitial markings are somewhat  coarse. The central pulmonary vascularity is prominent. On the  lateral film there are coarse lung markings in the retrocardiac  region likely bilaterally. A prominent crush-type prosthetic valve  in the aortic position is demonstrated. There is no significant  pleural effusion. The patient has undergone previous kyphoplasty for  a mid upper vertebral body compression. The patient has 5 intact  sternal wires demonstrated.     IMPRESSION:  The findings may reflect low-grade interstitial edema of cardiac  cause. Overall there has not been dramatic interval  change since the  previous study. Atelectasis or developing pneumonia in the lower  lobes on the lateral film cannotbe excluded.      Electronically Signed    By: David  Martinique    On: 01/06/2014 13:57         Verified By: DAVID A. Martinique, M.D., MD  Cardiology:    06-Jan-15 16:18, ED ECG  Ventricular Rate 93  Atrial Rate 93  P-R Interval 138  QRS Duration 84  QT 360  QTc 447  P Axis 71  R Axis 60  T Axis 69  ECG interpretation   Normal sinus rhythm  Possible Left atrial enlargement  ST & T wave abnormality, consider anterior ischemia  Abnormal ECG  When compared with ECG of 07-Dec-2013 10:47,  Non-specific change in ST segment in Inferior leads  T wave inversion now evident in Anterior leads  ----------unconfirmed----------  Confirmed by OVERREAD, NOT ([id number]), editor PEARSON, BARBARA ([id number]) on 01/08/2014 8:39:01 AM  ED ECG     07-Jan-15 09:42, Echo Doppler  Echo Doppler   REASON FOR EXAM:      COMMENTS:       PROCEDURE: [facility] - ECHO DOPPLER COMPLETE(TRANSTHOR)  - Jan 06 2014  9:42AM     RESULT: Echocardiogram Report    Patient Name:   KINDALL SWABY Date of Exam: 01/06/2014  Medical Rec #:  [id number]       Custom1:  Date of Birth:  02/06/1951    Height:  65.0 in  Patient Age:    33 years     Weight:       187.4 lb  Patient Gender: F            BSA:          1.92 m??    Indications: CHF  Sonographer:    Sherrie Sport RDCS  Referring Phys: Vaughan Basta    Sonographer Comments: Echo stopped after apicals-per pt request- felt   like she was going to vomit.    Summary:   1. Left ventricular ejection fraction, by visual estimation, is 60 to   65%.   2. Normal global left ventricular systolic function.   3. Mildly dilated left atrium.   4. Mild mitral valve regurgitation.   5. Moderate thickening and calcification of the anterior and posterior   mitral valve leaflets.   6. Myxomatous mitral valve.   7. Mildly elevated pulmonary artery systolic pressure.   8. Mild  to moderate tricuspid regurgitation.   9. Moderately increased left ventricular posterior wall thickness.  2D AND M-MODE MEASUREMENTS (normal ranges within parentheses):  Left Ventricle:          Normal  IVSd (2D):      1.26 cm (0.7-1.1)  LVPWd (2D):     1.40 cm (0.7-1.1) Aorta/LA:                  Normal  LVIDd (2D):     4.51 cm (3.4-5.7) Aortic Root (2D): 3.50 cm (2.4-3.7)  LVIDs (2D):     2.75 cm           Left Atrium (2D): 4.20 cm (1.9-4.0)  LV FS (2D):     39.0 %   (>25%)  LV EF (2D):     69.6 %   (>50%)                                    Right Ventricle:                                    RVd (2D):        2.69 cm  LV DIASTOLIC FUNCTION:  MV Peak E: 0.90 m/s E/e' Ratio: 12.40  MV Peak A: 1.18 m/s Decel Time: 335 msec  E/A Ratio: 0.77  SPECTRAL DOPPLER ANALYSIS (where applicable):  Mitral Valve:  MV P1/2 Time: 97.15 msec  MV Area, PHT: 2.26 cm??  Aortic Valve: AoV Max Vel: 2.80 m/s AoV Peak PG: 31.4 mmHg AoV Mean PG:   19.0 mmHg  LVOT Vmax: 0.90 m/s LVOT VTI: 0.232 m LVOT Diameter: 2.10 cm  AoV Area, Vmax: 1.11 cm?? AoV Area, VTI: 1.43 cm?? AoV Area, Vmn: 1.02 cm??  Tricuspid Valve and PA/RV Systolic Pressure: TR Max Velocity: 3.03 m/s RA   Pressure: 5 mmHg RVSP/PASP: 41.7 mmHg  Pulmonic Valve:  PV Max Velocity: 1.22 m/s PV Max PG: 6.0 mmHg PV Mean PG:    PHYSICIAN INTERPRETATION:  Left Ventricle: The left ventricular internal cavity size was normal. LV   septal wall thickness was normal. LV posterior wall thickness was   moderately increased. Global LV systolic function was normal. Left   ventricular ejection fraction, by visual estimation, is 60 to 65%.  Right Ventricle: The right ventricular size is normal. Global RV systolic   function is normal.  Left  Atrium: The left atrium is mildly dilated.  Right Atrium: The right atrium is normal in size.  Pericardium: There is no evidence of pericardial effusion.  Mitral Valve: The mitral valve is myxomatous. There is moderate    thickening and calcification of the anterior and posterior mitral valve   leaflets. Mild mitral valve regurgitation is seen. Can not r/o veg vs Ca#.  Tricuspid Valve: The tricuspid valve is normal. Mild to moderate   tricuspid regurgitation is visualized. The tricuspid regurgitant velocity   is 3.03 m/s, and with an assumed right atrial pressure of 5 mmHg, the     estimated right ventricular systolic pressure is mildly elevated at 41.7   mmHg.  Aortic Valve: The aortic valve is normal. No evidence of aortic valve   regurgitation is seen.  Pulmonic Valve: The pulmonic valve is normal.    Tiger MD  Electronically signed by [place] MD  Signature Date/Time: 01/06/2014/11:14:43 AM    *** Final ***    IMPRESSION: .      Verified By: Yolonda Kida, M.D., MD  CT:    08-Jan-15 15:40, CT Abdomen and Pelvis Without Contrast  CT Abdomen and Pelvis Without Contrast   REASON FOR EXAM:    (1) drop in hgb and lower abd pain. eval for intraabd   bleed; (2) drop in hgb and  COMMENTS:       PROCEDURE: CT  - CT ABDOMEN AND PELVIS W0  - Jan 07 2014  3:40PM     CLINICAL DATA:  On set of lower abdominal discomfort today, drop in  hemoglobin level    EXAM:  CT ABDOMEN AND PELVIS WITHOUT CONTRAST    TECHNIQUE:  Multidetector CT imaging of the abdomen and pelvis was performed  following the standard protocol without intravenous contrast.  COMPARISON:  None.    FINDINGS:  The liver exhibits no focal mass nor ductal dilation. The  gallbladder is surgically absent. The spleen exhibits normal density  and contour and does not exceed 11.2 cm in greatest dimension. The  pancreas, adrenal glands, and kidneys exhibit no acute  abnormalities. The left kidney is atrophic and exhibits several 1-2  mm diameter nonobstructing upper pole stones. There is a 2 mm  diameter midpole nonobstructing stone on the right. There is a small  amount of increased density in the  perinephric fat on the right  without evidence of a perinephric hematoma. There is no free fluid  in the abdomen or pelvis. The psoas musculature is normal in density  and contour with no evidence of a hematoma.  The abdominal aorta exhibits failure to taper of its caliber. There  is mural calcification but no evidence of an aneurysm. The patient  has undergone stent placement in the proximal common iliac arteries  bilaterally. There is no evidence of periaortic or peri-iliac  hemorrhage.    The stomach is nondistended. The non-opacified loops of small and  large bowel exhibit no evidence of ileus nor of obstruction. There  is sigmoid diverticulosis without evidence of acute diverticulitis.  The partially distended urinary bladder is normal in appearance.The  uterus is surgically absent. No adnexal masses are demonstrated.  There is no inguinal nor significant umbilical hernia.    The lung bases exhibit mild compressive atelectasis posteriorly with  small amounts of pleural thickening or fluid posteriorly. The lumbar  vertebral bodies are preserved in height. There is disc space  narrowing at L4-5 with posterior hard disc bulging. The  bony pelvis  exhibits no evidence of an acute fracture nor lytic or blastic  lesion. There is mild degenerative change of the right hip.    There is no evidence of a subcutaneous or deeper hematoma. Minimally  increased density in the subcutaneous fat over the hips bilaterally  is nonspecific.     IMPRESSION:  1. There is no evidence of an intraperitoneal or retroperitoneal  hemorrhage. No significant abnormality within the subcutaneous or  deeper soft tissues over the abdomen or pelvis is demonstrated.  2. There is no evidence of acute hepatobiliary abnormality.  3. There are chronic changes within the kidneys with left renal  atrophy and bilateral nonobstructing stones. Mildly increased  density in the perinephric fat on the right is nonspecific.  There is  no evidence of obstruction.  4. There is diverticulosis of the sigmoid colon but there is no  evidence of bowel obstruction or ileus.  5. There is mild atelectasis and minimal pleural thickening/fluid in  the posterior costophrenic gutters bilaterally.      Electronically Signed    By: David  Martinique    On: 01/07/2014 15:56         Verified By: DAVID A. Martinique, M.D., MD   Assessment/Plan:  Invasive Device Daily Assessment of Necessity:  Does the patient currently have any of the following indwelling devices? none   Assessment/Plan:  Assessment IMP Aypical CP SOB COPD HTN AoVR Bacteremia Anemia Elevated troponin .   Plan PLAN Antbx for pul infection Agree with transfusion as necessary D/C Heparin Hold ASA for now Agree with transfuse 8/24 Agree with Antibx IV Doubt MI Increas activity Inhalers for COPD 02 [place] supplemental Consider GI w/u   Electronic Signatures: Yolonda Kida (MD)  (Signed 09-Jan-15 11:43)  Authored: Chief Complaint, VITAL SIGNS/ANCILLARY NOTES, Brief Assessment, Lab Results, Radiology Results, Assessment/Plan   Last Updated: 09-Jan-15 11:43 by Lujean Amel D (MD)

## 2015-04-23 NOTE — Consult Note (Signed)
Chief Complaint:  Subjective/Chief Complaint Still unstead on her feet.Fell reasonablily well. Denies painmild sob   VITAL SIGNS/ANCILLARY NOTES: **Vital Signs.:   13-Jan-15 09:47  Vital Signs Type Post-Procedure  Temperature Temperature (F) 98.2  Celsius 36.7  Pulse Pulse 70  Respirations Respirations 16  Systolic BP Systolic BP 95  Diastolic BP (mmHg) Diastolic BP (mmHg) 57  Mean BP 69  Pulse Ox % Pulse Ox % 94  Oxygen Delivery 2L  *Intake and Output.:   Daily 13-Jan-15 07:00  Grand Totals Intake:   Output:  1850    Net:  -[id number] 54 Hr.:  -1850  Oral Intake      In:  0  Urine ml     Out:  1850  Length of Stay Totals Intake:  5418 Output:  8950    Net:  -[id number]  Rehab Summary:   13-Jan-15 14:47  Rehab Progress Summary Rehab Progress Summary Physical Therapy:  S: "I think I can do more, once I can do more."  A: Pt with limited insight to mobility deficits and requires encouragement to work with therapy to progress functional mobility. Pt then more motivated to get OOB. Gradual progress towards goals, however pt does present with safety concerns during ambulation ie. not using assistive device correctly. Refuses ther-ex secondary to fatigue.  P: Continue with POC to progress strength, ambulation, and functional mobility.  Gait Training Comments 4 x 30'-Pt ambulated using HW and contact guard assist demonstrating reciprocal gait pattern. Pt requires sitting rest break between each bout of ambulation and encouragement to continue ambulation. Pt reports increased fatigue post ambulation in addition to pain. Cues given for safe technique as her feet tend to get caught in the [facility] and she does not use appropriate judgement for obstacle avoidance. O2 donned during ambulation.  Anticipated Discharge Disposition  SNF/STR   Brief Assessment:  GEN well developed, well nourished, no acute distress   Cardiac Regular  murmur present  -- LE edema  -- JVD  --Gallop   Respiratory normal resp  effort  clear BS  rhonchi   Gastrointestinal Normal   Gastrointestinal details normal Soft  Nontender  Nondistended  No masses palpable  Bowel sounds normal   EXTR negative cyanosis/clubbing, negative edema   Lab Results: LabObservation:  13-Jan-15 08:10   OBSERVATION Reason for Test  TDMs:  13-Jan-15 02:04   Vancomycin, Trough LAB 17 (Result(s) reported on 12 Jan 2014 at 02:58AM.)  Routine Micro:  13-Jan-15 13:58   Micro Text Report BLOOD CULTURE   COMMENT                   NO GROWTH IN 48 HOURS   ANTIBIOTIC                       Culture Comment NO GROWTH IN 48 HOURS  Result(s) reported on 14 Jan 2014 at 02:00PM.  Specimen Source left hand    15:46   Micro Text Report BLOOD CULTURE   COMMENT                   NO GROWTH IN 48 HOURS   ANTIBIOTIC                       Culture Comment NO GROWTH IN 48 HOURS  Result(s) reported on 14 Jan 2014 at 04:00PM.  Cardiology:  13-Jan-15 08:10   TEE REASON FOR EXAM:     COMMENTS:     PROCEDURE:  [facility] - ECHO TRANSESOPHAGEAL  - Jan 12 2014  8:10AM   RESULT: Transesophageal Echocardiogram Report  Patient Name:   Lori Cooke Date of Exam: 01/12/2014 Medical Rec #:  [id number]       Custom1: Date of Birth:  March 05, 1951    Height: Patient Age:    64 years     Weight: Patient Gender: F            BSA:  Indications: Endocarditis Sonographer:    Sherrie Sport RDCS Referring Phys: Vaughan Basta SPECTRAL DOPPLER ANALYSIS (where applicable):  PROCEDURE: After discussion of the risks and benefits of the TEE, an  informed consent was obtained. Local oropharyngeal anesthetic was  provided with Benzocaine spray and viscous lidocaine. Intravenous  conscious sedation was provided with 33m Midazolam and 125 mcg Fentanyl.  The TEE probe was passed without difficulty. Imaged were obtained with  the patient in a left lateral decubitus position. Image quality was  excellent. The patient's vital signs; including heart rate, blood  pressure, and  oxygen saturation; remained stable throughout the  procedure. The patient tolerated the procedure well and without  complications.  PHYSICIAN INTERPRETATION: Left Ventricle: LV septal wall thickness was normal. LV posterior wall  thickness was normal. Global LV systolic function was normal. Left  ventricular ejection fraction, by visual estimation, is 60 to 65%. Left Atrium: The left atrium is normal in size. Right Atrium: The right atrium is normal in size. Mitral Valve: A pedunculated and mobile and moderate vegetation is seen  on the anterior mitral leaflet. The MV vegetation measures 1 mm x 1 mm.  Mild mitral valve regurgitation is seen. The MR jet is centrally-directed. Aortic Valve: Echo findings are consistent with vegetation of the aortic  prosthesis. Shunts: Agitated saline contrast was given intravenously to evaluate for  intracardiac shunting. Saline contrast bubble study was negative, with no  evidence of any intracardiac shunt.  Summary:  1. Left ventricular ejection fraction, by visual estimation, is 60 to  65%.  2. Normal global left ventricular systolic function.  3. Mild mitral valve regurgitation.  4. Pedunculated and mobile and moderate vegetation on the mitral valve.  5. Abnormalvegetation of the aortic prosthesis.  6. 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  7. Saline contrast bubble study was negative, with no evidence of any  intracardiac shunt. 1WarnerMD Electronically signed by [id number] BillMD Signature Date/Time: 01/12/2014/4:52:43 PM *** Final ***  IMPRESSION: .  Verified By: KTeodoro Spray M.D., MD  Routine Chem:  13-Jan-15 02:04   Creatinine (comp) 0.97  eGFR (African American) >60  eGFR (Non-African American) >60 (eGFR values <654mmin/1.73 m2 may be an indication of chronic kidney disease (CKD). Calculated eGFR is useful in patients with stable renal function. The eGFR calculation will not be reliable in acutely ill  patients when serum creatinine is changing rapidly. It is not useful in  patients on dialysis. The eGFR calculation may not be applicable to patients at the low and high extremes of body sizes, pregnant women, and vegetarians.)  Glucose, Serum 90  BUN 18  Sodium, Serum  134  Potassium, Serum 4.4  Chloride, Serum 102  CO2, Serum 30  Calcium (Total), Serum 8.6  Anion Gap  2  Osmolality (calc) 270  Routine Hem:  13-Jan-15 02:04   WBC (CBC)  19.0  RBC (CBC)  2.79  Hemoglobin (CBC)  8.1  Hematocrit (CBC)  24.3  Platelet Count (CBC) 238  MCV 87  MCH 29.2  MCHC 33.5  RDW 14.2  Neutrophil % 84.0  Lymphocyte % 12.0  Monocyte % 3.8  Eosinophil % 0.0  Basophil % 0.2  Neutrophil #  16.0  Lymphocyte # 2.3  Monocyte # 0.7  Eosinophil # 0.0  Basophil # 0.0 (Result(s) reported on 12 Jan 2014 at 02:56AM.)   Radiology Results: XRay:    06-Jan-15 16:41, Chest Portable Single View  Chest Portable Single View   REASON FOR EXAM:    shortness of breath; chest pain  COMMENTS:       PROCEDURE: DXR - DXR PORTABLE CHEST SINGLE VIEW  - Jan 05 2014  4:41PM     CLINICAL DATA:  Shortness of breath.  Chest pain.  Fall.    EXAM:  PORTABLE CHEST - 1 VIEW    COMPARISON:DG CHEST 2V dated 12/13/2013; DG CHEST 2V dated  12/11/2013; DG CHEST 2V dated 12/07/2013; CT CHEST W/ CM dated  11/22/2008    FINDINGS:  Mild cardiomegaly with cardiothoracic index of 58%. Mildly tortuous  thoracic aorta. Prior median sternotomy.    The lungs appear clear. No pneumothorax. No pleural effusion  observed. 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     IMPRESSION:  1. Mildly enlarged cardiopericardial silhouette.  2. 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      Electronically Signed    By: Sherryl Barters M.D.    On: 01/05/2014 16:52     Verified By: Carron Curie, M.D.,    06-Jan-15 16:41, Forearm Right  Forearm Right   REASON FOR EXAM:    s/p fall;  edema/pain  COMMENTS:       PROCEDURE: DXR - DXR FOREARM RIGHT  - Jan 05 2014  4:41PM     CLINICAL DATA:  Fall.  Forearm edema and pain.    EXAM:  RIGHT FOREARM - 2 VIEW    COMPARISON:  DG HAND COMPLETE 3+V*R* dated 01/05/2014    FINDINGS:  There is a fracture of the distal radius medially involving the  distal metaphysis and believed to be extending into the distal  radioulnar joint and possibly the distal radial articular surface  laterally.    I do not observe an ulnar fracture.  No elbow joint effusion.     IMPRESSION:  1. Fracture of the distal radius medially, likely extending into the  distal radioulnar joint and potentially the distal radial articular  surface.      Electronically Signed    By: Sherryl Barters M.D.    On: 01/05/2014 17:00     Verified By: Carron Curie, M.D.,    06-Jan-15 16:41, Hand Right Complete  Hand Right Complete   REASON FOR EXAM:    s/p fall; edema/pain  COMMENTS:       PROCEDURE: DXR - DXR HAND RT COMPLETE W/OBLIQUES  - Jan 05 2014  4:41PM     CLINICAL DATA:  Fall.  Edema and pain in the hand.    EXAM:  RIGHT HAND - COMPLETE 3+ VIEW    COMPARISON:  None.    FINDINGS:  Subtle distal radial fracture is just about occult on these  radiographs, but is better shown on the forearm radiographs obtained  concurrently. No additional fracture is observed.     IMPRESSION:  1. Distal radial fracture medially, with suspected extension into  the distal radial ulnar joint and possibly the distal radial  articular surface. This fracture is nearly occult on this exam, but  is better shown on the dedicated  forearm radiographs.      Electronically Signed    By: Sherryl Barters M.D.    On: 01/05/2014 16:54         Verified By: Carron Curie, M.D.,    07-Jan-15 13:31, Chest PA and Lateral  Chest PA and Lateral   REASON FOR EXAM:    chf  COMMENTS:       PROCEDURE: DXR - DXR CHEST PA (OR AP) AND LATERAL  - Jan 06 2014   1:31PM     CLINICAL DATA:  History of CHF now with dyspnea    EXAM:  CHEST  2 VIEW    COMPARISON:  Portable chest x-ray January 05, 2014    FINDINGS:  The lungs are well-expanded. The interstitial markings are somewhat  coarse. The central pulmonary vascularity is prominent. On the  lateral film there are coarse lung markings in the retrocardiac  region likely bilaterally. A prominent crush-type prosthetic valve  in the aortic position is demonstrated. There is no significant  pleural effusion. The patient has undergone previous kyphoplasty for  a mid upper vertebral body compression. The patient has 5 intact  sternal wires demonstrated.     IMPRESSION:  The findings may reflect low-grade interstitial edema of cardiac  cause. Overall there has not been dramatic interval change since the  previous study. Atelectasis or developing pneumonia in the lower  lobes on the lateral film cannotbe excluded.      Electronically Signed    By: David  Martinique    On: 01/06/2014 13:57         Verified By: DAVID A. Martinique, M.D., MD    09-Jan-15 19:22, Knee Right Complete  Knee Right Complete   REASON FOR EXAM:    s/p fall and patient c/o right knee pain  COMMENTS:       PROCEDURE: DXR - DXR KNEE RT COMP WITH OBLIQUES  - Jan 08 2014  7:22PM     CLINICAL DATA:  Fall with right knee pain.    EXAM:  RIGHT KNEE - COMPLETE 4+ VIEW    COMPARISON: None    FINDINGS:  There is no evidence of acute fracture, subluxation or dislocation.  There is no evidence of joint effusion.    Mild sclerotic irregularity of the proximal tibia may represent an  infarct.    No other focal bony abnormalities are present.    The joint spaces are otherwise unremarkable.     IMPRESSION:  No evidence of acute abnormality.      Electronically Signed    By: Hassan Rowan M.D.    On: 01/08/2014 19:37         Verified By: Lura Em, M.D.,    10-Jan-15 15:47, Chest 1 View AP or PA  Chest 1 View AP or PA    REASON FOR EXAM:    verify PICC placement correct  COMMENTS:       PROCEDURE: DXR - DXR CHEST 1 VIEWAP OR PA  - Jan 09 2014  3:47PM     CLINICAL DATA:  PICC line placement    EXAM:  CHEST - 1 VIEW    COMPARISON:  01/06/2014    FINDINGS:  Cardiomediastinal silhouette is stable. Mild interstitial prominence  bilaterally again noted. There is left arm PICC line with tip in SVC  right atrium junction. No diagnostic pneumothorax. No segmental  infiltrate.     IMPRESSION:  Left arm PICC line with tip in SVC right atrium junction. No  diagnostic pneumothorax.      Electronically Signed    By: Lahoma Crocker M.D.    On: 01/09/2014 15:58         Verified By: Ephraim Hamburger, M.D.,  Cardiology:    06-Jan-15 16:18, ED ECG  Ventricular Rate 93  Atrial Rate 93  P-R Interval 138  QRS Duration 84  QT 360  QTc 447  P Axis 71  R Axis 60  T Axis 69  ECG interpretation   Normal sinus rhythm  Possible Left atrial enlargement  ST & T wave abnormality, consider anterior ischemia  Abnormal ECG  When compared with ECG of 07-Dec-2013 10:47,  Non-specific change in ST segment in Inferior leads  T wave inversion now evident in Anterior leads  ----------unconfirmed----------  Confirmed by OVERREAD, NOT ([id number]), editor PEARSON, BARBARA ([id number]) on 01/08/2014 8:39:01 AM  ED ECG     07-Jan-15 09:42, Echo Doppler  Echo Doppler   REASON FOR EXAM:      COMMENTS:       PROCEDURE: [facility] - ECHO DOPPLER COMPLETE(TRANSTHOR)  - Jan 06 2014  9:42AM     RESULT: Echocardiogram Report    Patient Name:   Lori Cooke Date of Exam: 01/06/2014  Medical Rec #:  [id number]       Custom1:  Date of Birth:  04-14-1951    Height:       65.0 in  Patient Age:    25 years     Weight:       187.4 lb  Patient Gender: F            BSA:          1.92 m??    Indications: CHF  Sonographer:    Sherrie Sport RDCS  Referring Phys: Vaughan Basta    Sonographer Comments: Echo stopped after apicals-per pt request- felt   like  she was going to vomit.    Summary:   1. Left ventricular ejection fraction, by visual estimation, is 60 to   65%.   2. Normal global left ventricular systolic function.   3. Mildly dilated left atrium.   4. Mild mitral valve regurgitation.   5. Moderate thickening and calcification of the anterior and posterior   mitral valve leaflets.   6. Myxomatous mitral valve.   7. Mildly elevated pulmonary artery systolic pressure.   8. Mild to moderate tricuspid regurgitation.   9. Moderately increased left ventricular posterior wall thickness.  2D AND M-MODE MEASUREMENTS (normal ranges within parentheses):  Left Ventricle:          Normal  IVSd (2D):      1.26 cm (0.7-1.1)  LVPWd (2D):     1.40 cm (0.7-1.1) Aorta/LA:                  Normal  LVIDd (2D):     4.51 cm (3.4-5.7) Aortic Root (2D): 3.50 cm (2.4-3.7)  LVIDs (2D):     2.75 cm           Left Atrium (2D): 4.20 cm (1.9-4.0)  LV FS (2D):     39.0 %   (>25%)  LV EF (2D):     69.6 %   (>50%)                                    Right Ventricle:  RVd (2D):        8.09 cm  LV DIASTOLIC FUNCTION:  MV Peak E: 0.90 m/s E/e' Ratio: 12.40  MV Peak A: 1.18 m/s Decel Time: 335 msec  E/A Ratio: 0.77  SPECTRAL DOPPLER ANALYSIS (where applicable):  Mitral Valve:  MV P1/2 Time: 97.15 msec  MV Area, PHT: 2.26 cm??  Aortic Valve: AoV Max Vel: 2.80 m/s AoV Peak PG: 31.4 mmHg AoV Mean PG:   19.0 mmHg  LVOT Vmax: 0.90 m/s LVOT VTI: 0.232 m LVOT Diameter: 2.10 cm  AoV Area, Vmax: 1.11 cm?? AoV Area, VTI: 1.43 cm?? AoV Area, Vmn: 1.02 cm??  Tricuspid Valve and PA/RV Systolic Pressure: TR Max Velocity: 3.03 m/s RA   Pressure: 5 mmHg RVSP/PASP: 41.7 mmHg  Pulmonic Valve:  PV Max Velocity: 1.22 m/s PV Max PG: 6.0 mmHg PV Mean PG:    PHYSICIAN INTERPRETATION:  Left Ventricle: The left ventricular internal cavity size was normal. LV   septal wall thickness was normal. LV posterior wall thickness was   moderately  increased. Global LV systolic function was normal. Left   ventricular ejection fraction, by visual estimation, is 60 to 65%.  Right Ventricle: The right ventricular size is normal. Global RV systolic   function is normal.  Left Atrium: The left atrium is mildly dilated.  Right Atrium: The right atrium is normal in size.  Pericardium: There is no evidence of pericardial effusion.  Mitral Valve: The mitral valve is myxomatous. There is moderate   thickening and calcification of the anterior and posterior mitral valve   leaflets. Mild mitral valve regurgitation is seen. Can not r/o veg vs Ca#.  Tricuspid Valve: The tricuspid valve is normal. Mild to moderate   tricuspid regurgitation is visualized. The tricuspid regurgitant velocity   is 3.03 m/s, and with an assumed right atrial pressure of 5 mmHg, the     estimated right ventricular systolic pressure is mildly elevated at 41.7   mmHg.  Aortic Valve: The aortic valve is normal. No evidence of aortic valve   regurgitation is seen.  Pulmonic Valve: The pulmonic valve is normal.    [place] MD  Electronically signed by [place] MD  Signature Date/Time: 01/06/2014/11:14:43 AM    *** Final ***    IMPRESSION: .      Verified By: Yolonda Kida, M.D., MD    13-Jan-15 08:10, TEE  TEE   REASON FOR EXAM:      COMMENTS:       PROCEDURE: [facility] - ECHO TRANSESOPHAGEAL  - Jan 12 2014  8:10AM     RESULT: Transesophageal Echocardiogram Report    Patient Name:   Lori Cooke Date of Exam: 01/12/2014  Medical Rec #:  [id number]       Custom1:  Date of Birth:  11-10-1951    Height:  Patient Age:    15 years     Weight:  Patient Gender: F            BSA:    Indications: Endocarditis  Sonographer:    Sherrie Sport RDCS  Referring Phys: Vaughan Basta  SPECTRAL DOPPLER ANALYSIS (where applicable):    PROCEDURE: After discussion of the risks and benefits of the TEE, an   informed consent was obtained. Local  oropharyngeal anesthetic was   provided with Benzocaine spray and viscous lidocaine. Intravenous   conscious sedation was provided with 17m Midazolam and 125 mcg Fentanyl.   The TEE probe was passed without difficulty. Imaged were obtained  with   the patient in a left lateral decubitus position. Image quality was   excellent. The patient's vital signs; including heart rate, blood   pressure, and oxygen saturation; remained stable throughout the   procedure. The patient tolerated the procedure well and without   complications.    PHYSICIAN INTERPRETATION:  Left Ventricle: LV septal wall thickness was normal. LV posterior wall   thickness was normal. Global LV systolic function was normal. Left   ventricular ejection fraction, by visual estimation, is 60 to 65%.  Left Atrium: The left atrium is normal in size.  Right Atrium: The right atrium is normal in size.  Mitral Valve: A pedunculated and mobile and moderate vegetation is seen   on the anterior mitral leaflet. The MV vegetation measures 1 mm x 1 mm.   Mild mitral valve regurgitation is seen. The MR jet is centrally-directed.  Aortic Valve: Echo findings are consistent with vegetation of the aortic   prosthesis.  Shunts: Agitated saline contrast was given intravenously to evaluate for   intracardiac shunting. Saline contrast bubble study was negative, with no   evidence of any intracardiac shunt.    Summary:   1. Left ventricular ejection fraction, by visual estimation, is 60 to   65%.   2. Normal global left ventricular systolic function.   3. Mild mitral valve regurgitation.   4. Pedunculated and mobile and moderate vegetation on the mitral valve.   5. Abnormalvegetation of the aortic prosthesis.   6. 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   7. Saline contrast bubble study was negative, with no evidence of any   intracardiac shunt.  [place] MD  Electronically signed by [id number] Bartholome Bill MD  Signature Date/Time:  01/12/2014/4:52:43 PM  *** Final ***    IMPRESSION: .    Verified By: Teodoro Spray, M.D., MD  CT:    08-Jan-15 15:40, CT Abdomen and Pelvis Without Contrast  CT Abdomen and Pelvis Without Contrast   REASON FOR EXAM:    (1) drop in hgb and lower abd pain. eval for intraabd   bleed; (2) drop in hgb and  COMMENTS:       PROCEDURE: CT  - CT ABDOMEN AND PELVIS W0  - Jan 07 2014  3:40PM     CLINICAL DATA:  On set of lower abdominal discomfort today, drop in  hemoglobin level    EXAM:  CT ABDOMEN AND PELVIS WITHOUT CONTRAST    TECHNIQUE:  Multidetector CT imaging of the abdomen and pelvis was performed  following the standard protocol without intravenous contrast.  COMPARISON:  None.    FINDINGS:  The liver exhibits no focal mass nor ductal dilation. The  gallbladder is surgically absent. The spleen exhibits normal density  and contour and does not exceed 11.2 cm in greatest dimension. The  pancreas, adrenal glands, and kidneys exhibit no acute  abnormalities. The left kidney is atrophic and exhibits several 1-2  mm diameter nonobstructing upper pole stones. There is a 2 mm  diameter midpole nonobstructing stone on the right. There is a small  amount of increased density in the perinephric fat on the right  without evidence of a perinephric hematoma. There is no free fluid  in the abdomen or pelvis. The psoas musculature is normal in density  and contour with no evidence of a hematoma.  The abdominal aorta exhibits failure to taper of its caliber. There  is mural calcification but no evidence of an aneurysm. The patient  has  undergone stent placement in the proximal common iliac arteries  bilaterally. There is no evidence of periaortic or peri-iliac  hemorrhage.    The stomach is nondistended. The non-opacified loops of small and  large bowel exhibit no evidence of ileus nor of obstruction. There  is sigmoid diverticulosis without evidence of acute diverticulitis.  The  partially distended urinary bladder is normal in appearance.The  uterus is surgically absent. No adnexal masses are demonstrated.  There is no inguinal nor significant umbilical hernia.    The lung bases exhibit mild compressive atelectasis posteriorly with  small amounts of pleural thickening or fluid posteriorly. The lumbar  vertebral bodies are preserved in height. There is disc space  narrowing at L4-5 with posterior hard disc bulging. The bony pelvis  exhibits no evidence of an acute fracture nor lytic or blastic  lesion. There is mild degenerative change of the right hip.    There is no evidence of a subcutaneous or deeper hematoma. Minimally  increased density in the subcutaneous fat over the hips bilaterally  is nonspecific.     IMPRESSION:  1. There is no evidence of an intraperitoneal or retroperitoneal  hemorrhage. No significant abnormality within the subcutaneous or  deeper soft tissues over the abdomen or pelvis is demonstrated.  2. There is no evidence of acute hepatobiliary abnormality.  3. There are chronic changes within the kidneys with left renal  atrophy and bilateral nonobstructing stones. Mildly increased  density in the perinephric fat on the right is nonspecific. There is  no evidence of obstruction.  4. There is diverticulosis of the sigmoid colon but there is no  evidence of bowel obstruction or ileus.  5. There is mild atelectasis and minimal pleural thickening/fluid in  the posterior costophrenic gutters bilaterally.      Electronically Signed    By: David  Martinique    On: 01/07/2014 15:56         Verified By: DAVID A. Martinique, M.D., MD   Assessment/Plan:  Invasive Device Daily Assessment of Necessity:  Does the patient currently have any of the following indwelling devices? none   Assessment/Plan:  Assessment IMP Endocarditis S/P TEE/ veg Aypical CP SOB COPD HTN AoVR Bacteremia Anemia Elevated troponin .   Plan PLAN No evidence  that valve surgery is indicated Long term antibx IV for veg on bioprosthetic valve Agree with pic line Antbx for pul infection Will need f/u bld cultures Agree with transfusion as necessary D/C Heparin Hold ASA for now Agree with transfuse 8/24 Agree with Antibx IV Doubt MI Increase activity Inhalers for COPD 02 [place] supplemental Consider GI w/u possible CT   Electronic Signatures: Yolonda Kida (MD)  (Signed 16-Jan-15 00:25)  Authored: Chief Complaint, VITAL SIGNS/ANCILLARY NOTES, Brief Assessment, Lab Results, Radiology Results, Assessment/Plan   Last Updated: 16-Jan-15 00:25 by Lujean Amel D (MD)

## 2015-04-23 NOTE — Consult Note (Signed)
Chief Complaint:  Subjective/Chief Complaint Denies f/c/s no n/n/d. Tolerating antibxwell   VITAL SIGNS/ANCILLARY NOTES: **Vital Signs.:   14-Jan-15 06:18  Vital Signs Type Routine  Temperature Temperature (F) 98.1  Celsius 36.7  Temperature Source oral  Pulse Pulse 76  Respirations Respirations 20  Systolic BP Systolic BP 126  Diastolic BP (mmHg) Diastolic BP (mmHg) 69  Mean BP 88  Pulse Ox % Pulse Ox % 96  Pulse Ox Activity Level  At rest  Oxygen Delivery Room Air/ 21 %  *Intake and Output.:   14-Jan-15 06:18  Grand Totals Intake:   Output:      Net:   24 Hr.:  -193  Weight Type daily  Weight Method Bed  Current Weight (lbs) (lbs) 172.7  Current Weight (kg) (kg) 78.3  Height (ft) (feet) 5  Height (in) (in) 5  Height (cm) centimeters 165.1  BSA (m2) 1.8  BMI (kg/m2) 28.7  Rehab Summary:   14-Jan-15 10:48  Rehab Progress Summary Rehab Progress Summary Physical Therapy:  S: "I'm hurting really bad today."  A: Pt is making good progress towards goals, however is self-limiting secondary to back pain. Therapist tries to find relieving positions for pain including sitting in recliner, however pt refuses. Pt very irritable this session, only participatory with mod cues. Pt able to perform limited ambulation with no loss of balance and ther-ex with good endurance.  P: Continue with POC to progress strength, ambulation, and functional mobility.  Gait Training Comments 20'-Pt ambulated using no assistive device, however does hold onto bed rails. Pt limps on R lower extremity secondary to pain. Therapist suggested pt to use QC, however pt discards it and holds onto furniture. No loss of balance noted, however pt fatigues with exertion. O2 donned for all mobility.  Anticipated Discharge Disposition  home with home health; SNF/STR; dependent on progress/participation with therapy, may be able to go home   Brief Assessment:  GEN well developed, well nourished, no acute distress    Cardiac Regular  murmur present  -- LE edema  -- JVD  --Gallop   Respiratory normal resp effort  clear BS  rhonchi   Gastrointestinal Normal   Gastrointestinal details normal Soft  Nontender  Nondistended  No masses palpable  Bowel sounds normal   EXTR negative cyanosis/clubbing, negative edema   Lab Results: General Ref:  10-Jan-15 09:13   Immunoelectrophoresis Random Urine ========== TEST NAME ==========  ========= RESULTS =========  = REFERENCE RANGE =  URINE IEP, RANDOM  IFE and PE, Random Urine Protein,Total,Urine             [H  16.1 mg/dL           ]          9.5-28.4 Albumin, U                      [   22.0%               ]                   Alpha-1-Globulin, U             [   6.0 %                ]                   Alpha-2-Globulin, U             [  8.0 %                ]                   Beta Globulin, U                [   55.9 %               ]                   Gamma Globulin, U               [   8.0 %                ]                   M-Spike, %                      [   Not Observed %       ]      Not Observed Immunofixation Result, Urine    [   Final Report         ]  An apparent normal immunofixation pattern.   Note:                         [   Final Report         ]                   Protein electrophoresis scan will follow via computer, mail, or courier delivery.               [facility]    No: [id number]           [address] [place], [place] [address]           Mila Homer, MD         [id number]   Result(s) reported on 11 Jan 2014 at 04:51PM.  Routine Hem:  10-Jan-15 04:31   WBC (CBC)  13.5  RBC (CBC)  2.93  Hemoglobin (CBC)  8.5  Hematocrit (CBC)  25.2  Platelet Count (CBC)  121 (Result(s) reported on 09 Jan 2014 at 07:28AM.)  MCV 86  MCH 28.9  MCHC 33.6  RDW 14.5  Segmented Neutrophils 73  Lymphocytes 16  Monocytes 7  Diff Comment 1 PLTS VARIED IN SIZE  Diff Comment 2 RBCs APPEAR NORMAL  Result(s) reported on 09 Jan 2014 at 07:28AM.  Bands 3  Metamyelocyte 1  Retic Count 1.40  Absolute Retic Count 0.0410 (Result(s) reported on 09 Jan 2014 at [facility].)   Radiology Results: XRay:    06-Jan-15 16:41, Chest Portable Single View  Chest Portable Single View   REASON FOR EXAM:    shortness of breath; chest pain  COMMENTS:       PROCEDURE: DXR - DXR PORTABLE CHEST SINGLE VIEW  - Jan 05 2014  4:41PM     CLINICAL DATA:  Shortness of breath.  Chest pain.  Fall.    EXAM:  PORTABLE CHEST - 1 VIEW    COMPARISON:DG CHEST 2V dated 12/13/2013; DG CHEST 2V dated  12/11/2013; DG CHEST 2V dated 12/07/2013; CT CHEST W/ CM dated  11/22/2008    FINDINGS:  Mild cardiomegaly with cardiothoracic index of 58%. Mildly tortuous  thoracic aorta. Prior median sternotomy.    The lungs  appear clear. No pneumothorax. No pleural effusion  observed. 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     IMPRESSION:  1. Mildly enlarged cardiopericardial silhouette.  2. 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      Electronically Signed    By: Herbie Baltimore M.D.    On: 01/05/2014 16:52     Verified By: Dellia Cloud, M.D.,    06-Jan-15 16:41, Forearm Right  Forearm Right   REASON FOR EXAM:    s/p fall; edema/pain  COMMENTS:       PROCEDURE: DXR - DXR FOREARM RIGHT  - Jan 05 2014  4:41PM     CLINICAL DATA:  Fall.  Forearm edema and pain.    EXAM:  RIGHT FOREARM - 2 VIEW    COMPARISON:  DG HAND COMPLETE 3+V*R* dated 01/05/2014    FINDINGS:  There is a fracture of the distal radius medially involving the  distal metaphysis and believed to be extending into the distal  radioulnar joint and possibly the distal radial articular surface  laterally.    I do not observe an ulnar fracture.  No elbow joint effusion.     IMPRESSION:  1. Fracture of the distal radius medially, likely extending into the  distal radioulnar joint and potentially the distal radial  articular  surface.      Electronically Signed    By: Herbie Baltimore M.D.    On: 01/05/2014 17:00     Verified By: Dellia Cloud, M.D.,    06-Jan-15 16:41, Hand Right Complete  Hand Right Complete   REASON FOR EXAM:    s/p fall; edema/pain  COMMENTS:       PROCEDURE: DXR - DXR HAND RT COMPLETE W/OBLIQUES  - Jan 05 2014  4:41PM     CLINICAL DATA:  Fall.  Edema and pain in the hand.    EXAM:  RIGHT HAND - COMPLETE 3+ VIEW    COMPARISON:  None.    FINDINGS:  Subtle distal radial fracture is just about occult on these  radiographs, but is better shown on the forearm radiographs obtained  concurrently. No additional fracture is observed.     IMPRESSION:  1. Distal radial fracture medially, with suspected extension into  the distal radial ulnar joint and possibly the distal radial  articular surface. This fracture is nearly occult on this exam, but  is better shown on the dedicated forearm radiographs.      Electronically Signed    By: Herbie Baltimore M.D.    On: 01/05/2014 16:54         Verified By: Dellia Cloud, M.D.,    07-Jan-15 13:31, Chest PA and Lateral  Chest PA and Lateral   REASON FOR EXAM:    chf  COMMENTS:       PROCEDURE: DXR - DXR CHEST PA (OR AP) AND LATERAL  - Jan 06 2014  1:31PM     CLINICAL DATA:  History of CHF now with dyspnea    EXAM:  CHEST  2 VIEW    COMPARISON:  Portable chest x-ray January 05, 2014    FINDINGS:  The lungs are well-expanded. The interstitial markings are somewhat  coarse. The central pulmonary vascularity is prominent. On the  lateral film there are coarse lung markings in the retrocardiac  region likely bilaterally. A prominent crush-type prosthetic valve  in the aortic position is demonstrated. There is no significant  pleural effusion. The patient has undergone previous kyphoplasty  for  a mid upper vertebral body compression. The patient has 5 intact  sternal wires demonstrated.      IMPRESSION:  The findings may reflect low-grade interstitial edema of cardiac  cause. Overall there has not been dramatic interval change since the  previous study. Atelectasis or developing pneumonia in the lower  lobes on the lateral film cannotbe excluded.      Electronically Signed    By: David  [place]    On: 01/06/2014 13:57         Verified By: DAVID A. [place], M.D., MD    09-Jan-15 19:22, Knee Right Complete  Knee Right Complete   REASON FOR EXAM:    s/p fall and patient c/o right knee pain  COMMENTS:       PROCEDURE: DXR - DXR KNEE RT COMP WITH OBLIQUES  - Jan 08 2014  7:22PM     CLINICAL DATA:  Fall with right knee pain.    EXAM:  RIGHT KNEE - COMPLETE 4+ VIEW    COMPARISON: None    FINDINGS:  There is no evidence of acute fracture, subluxation or dislocation.  There is no evidence of joint effusion.    Mild sclerotic irregularity of the proximal tibia may represent an  infarct.    No other focal bony abnormalities are present.    The joint spaces are otherwise unremarkable.     IMPRESSION:  No evidence of acute abnormality.      Electronically Signed    By: Laveda Abbe M.D.    On: 01/08/2014 19:37         Verified By: Rosendo Gros, M.D.,    10-Jan-15 15:47, Chest 1 View AP or PA  Chest 1 View AP or PA   REASON FOR EXAM:    verify PICC placement correct  COMMENTS:       PROCEDURE: DXR - DXR CHEST 1 VIEWAP OR PA  - Jan 09 2014  3:47PM     CLINICAL DATA:  PICC line placement    EXAM:  CHEST - 1 VIEW    COMPARISON:  01/06/2014    FINDINGS:  Cardiomediastinal silhouette is stable. Mild interstitial prominence  bilaterally again noted. There is left arm PICC line with tip in SVC  right atrium junction. No diagnostic pneumothorax. No segmental  infiltrate.     IMPRESSION:  Left arm PICC line with tip in SVC right atrium junction. No  diagnostic pneumothorax.      Electronically Signed    By: Natasha Mead M.D.    On: 01/09/2014 15:58          Verified By: Kennieth Francois, M.D.,  Cardiology:    06-Jan-15 16:18, ED ECG  Ventricular Rate 93  Atrial Rate 93  P-R Interval 138  QRS Duration 84  QT 360  QTc 447  P Axis 71  R Axis 60  T Axis 69  ECG interpretation   Normal sinus rhythm  Possible Left atrial enlargement  ST & T wave abnormality, consider anterior ischemia  Abnormal ECG  When compared with ECG of 07-Dec-2013 10:47,  Non-specific change in ST segment in Inferior leads  T wave inversion now evident in Anterior leads  ----------unconfirmed----------  Confirmed by OVERREAD, NOT ([id number]), editor PEARSON, BARBARA ([id number]) on 01/08/2014 8:39:01 AM  ED ECG     07-Jan-15 09:42, Echo Doppler  Echo Doppler   REASON FOR EXAM:      COMMENTS:       PROCEDURE: ECH - ECHO  DOPPLER COMPLETE(TRANSTHOR)  - Jan 06 2014  9:42AM     RESULT: Echocardiogram Report    Patient Name:   Lori Cooke Date of Exam: 01/06/2014  Medical Rec #:  [id number]       Custom1:  Date of Birth:  05-01-1951    Height:       65.0 in  Patient Age:    64 years     Weight:       187.4 lb  Patient Gender: F            BSA:          1.92 m??    Indications: CHF  Sonographer:    Cristela Blue RDCS  Referring Phys: Altamese Dilling    Sonographer Comments: Echo stopped after apicals-per pt request- felt   like she was going to vomit.    Summary:   1. Left ventricular ejection fraction, by visual estimation, is 60 to   65%.   2. Normal global left ventricular systolic function.   3. Mildly dilated left atrium.   4. Mild mitral valve regurgitation.   5. Moderate thickening and calcification of the anterior and posterior   mitral valve leaflets.   6. Myxomatous mitral valve.   7. Mildly elevated pulmonary artery systolic pressure.   8. Mild to moderate tricuspid regurgitation.   9. Moderately increased left ventricular posterior wall thickness.  2D AND M-MODE MEASUREMENTS (normal ranges within parentheses):  Left Ventricle:          Normal  IVSd  (2D):      1.26 cm (0.7-1.1)  LVPWd (2D):     1.40 cm (0.7-1.1) Aorta/LA:                  Normal  LVIDd (2D):     4.51 cm (3.4-5.7) Aortic Root (2D): 3.50 cm (2.4-3.7)  LVIDs (2D):     2.75 cm           Left Atrium (2D): 4.20 cm (1.9-4.0)  LV FS (2D):     39.0 %   (>25%)  LV EF (2D):     69.6 %   (>50%)                                    Right Ventricle:                                    RVd (2D):        2.45 cm  LV DIASTOLIC FUNCTION:  MV Peak E: 0.90 m/s E/e' Ratio: 12.40  MV Peak A: 1.18 m/s Decel Time: 335 msec  E/A Ratio: 0.77  SPECTRAL DOPPLER ANALYSIS (where applicable):  Mitral Valve:  MV P1/2 Time: 97.15 msec  MV Area, PHT: 2.26 cm??  Aortic Valve: AoV Max Vel: 2.80 m/s AoV Peak PG: 31.4 mmHg AoV Mean PG:   19.0 mmHg  LVOT Vmax: 0.90 m/s LVOT VTI: 0.232 m LVOT Diameter: 2.10 cm  AoV Area, Vmax: 1.11 cm?? AoV Area, VTI: 1.43 cm?? AoV Area, Vmn: 1.02 cm??  Tricuspid Valve and PA/RV Systolic Pressure: TR Max Velocity: 3.03 m/s RA   Pressure: 5 mmHg RVSP/PASP: 41.7 mmHg  Pulmonic Valve:  PV Max Velocity: 1.22 m/s PV Max PG: 6.0 mmHg PV Mean PG:    PHYSICIAN INTERPRETATION:  Left Ventricle: The left ventricular internal  cavity size was normal. LV   septal wall thickness was normal. LV posterior wall thickness was   moderately increased. Global LV systolic function was normal. Left   ventricular ejection fraction, by visual estimation, is 60 to 65%.  Right Ventricle: The right ventricular size is normal. Global RV systolic   function is normal.  Left Atrium: The left atrium is mildly dilated.  Right Atrium: The right atrium is normal in size.  Pericardium: There is no evidence of pericardial effusion.  Mitral Valve: The mitral valve is myxomatous. There is moderate   thickening and calcification of the anterior and posterior mitral valve   leaflets. Mild mitral valve regurgitation is seen. Can not r/o veg vs Ca#.  Tricuspid Valve: The tricuspid valve is normal. Mild to  moderate   tricuspid regurgitation is visualized. The tricuspid regurgitant velocity   is 3.03 m/s, and with an assumed right atrial pressure of 5 mmHg, the     estimated right ventricular systolic pressure is mildly elevated at 41.7   mmHg.  Aortic Valve: The aortic valve is normal. No evidence of aortic valve   regurgitation is seen.  Pulmonic Valve: The pulmonic valve is normal.    [id number] Osmond Steckman MD  Electronically signed by [id number] Dorothyann Peng MD  Signature Date/Time: 01/06/2014/11:14:43 AM    *** Final ***    IMPRESSION: .      Verified By: Alwyn Pea, M.D., MD    13-Jan-15 08:10, TEE  TEE   REASON FOR EXAM:      COMMENTS:       PROCEDURE: [facility] - ECHO TRANSESOPHAGEAL  - Jan 12 2014  8:10AM     RESULT: Transesophageal Echocardiogram Report    Patient Name:   Lori Cooke Date of Exam: 01/12/2014  Medical Rec #:  [id number]       Custom1:  Date of Birth:  1951-09-29    Height:  Patient Age:    64 years     Weight:  Patient Gender: F            BSA:    Indications: Endocarditis  Sonographer:    Cristela Blue RDCS  Referring Phys: Altamese Dilling  SPECTRAL DOPPLER ANALYSIS (where applicable):    PROCEDURE: After discussion of the risks and benefits of the TEE, an   informed consent was obtained. Local oropharyngeal anesthetic was   provided with Benzocaine spray and viscous lidocaine. Intravenous   conscious sedation was provided with 6mg  Midazolam and 125 mcg Fentanyl.   The TEE probe was passed without difficulty. Imaged were obtained with   the patient in a left lateral decubitus position. Image quality was   excellent. The patient's vital signs; including heart rate, blood   pressure, and oxygen saturation; remained stable throughout the   procedure. The patient tolerated the procedure well and without   complications.    PHYSICIAN INTERPRETATION:  Left Ventricle: LV septal wall thickness was normal. LV posterior wall   thickness was normal. Global  LV systolic function was normal. Left   ventricular ejection fraction, by visual estimation, is 60 to 65%.  Left Atrium: The left atrium is normal in size.  Right Atrium: The right atrium is normal in size.  Mitral Valve: A pedunculated and mobile and moderate vegetation is seen   on the anterior mitral leaflet. The MV vegetation measures 1 mm x 1 mm.   Mild mitral valve regurgitation is seen. The MR jet is centrally-directed.  Aortic Valve: Echo findings are consistent  with vegetation of the aortic   prosthesis.  Shunts: Agitated saline contrast was given intravenously to evaluate for   intracardiac shunting. Saline contrast bubble study was negative, with no   evidence of any intracardiac shunt.    Summary:   1. Left ventricular ejection fraction, by visual estimation, is 60 to   65%.   2. Normal global left ventricular systolic function.   3. Mild mitral valve regurgitation.   4. Pedunculated and mobile and moderate vegetation on the mitral valve.   5. Abnormalvegetation of the aortic prosthesis.   6. 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   7. Saline contrast bubble study was negative, with no evidence of any   intracardiac shunt.  [id number] Harold HedgeKenneth Fath MD  Electronically signed by [id number] Harold HedgeKenneth Fath MD  Signature Date/Time: 01/12/2014/4:52:43 PM  *** Final ***    IMPRESSION: .    Verified By: Dalia HeadingKENNETH A. FATH, M.D., MD  CT:    08-Jan-15 15:40, CT Abdomen and Pelvis Without Contrast  CT Abdomen and Pelvis Without Contrast   REASON FOR EXAM:    (1) drop in hgb and lower abd pain. eval for intraabd   bleed; (2) drop in hgb and  COMMENTS:       PROCEDURE: CT  - CT ABDOMEN AND PELVIS W0  - Jan 07 2014  3:40PM     CLINICAL DATA:  On set of lower abdominal discomfort today, drop in  hemoglobin level    EXAM:  CT ABDOMEN AND PELVIS WITHOUT CONTRAST    TECHNIQUE:  Multidetector CT imaging of the abdomen and pelvis was performed  following the standard protocol without intravenous  contrast.  COMPARISON:  None.    FINDINGS:  The liver exhibits no focal mass nor ductal dilation. The  gallbladder is surgically absent. The spleen exhibits normal density  and contour and does not exceed 11.2 cm in greatest dimension. The  pancreas, adrenal glands, and kidneys exhibit no acute  abnormalities. The left kidney is atrophic and exhibits several 1-2  mm diameter nonobstructing upper pole stones. There is a 2 mm  diameter midpole nonobstructing stone on the right. There is a small  amount of increased density in the perinephric fat on the right  without evidence of a perinephric hematoma. There is no free fluid  in the abdomen or pelvis. The psoas musculature is normal in density  and contour with no evidence of a hematoma.  The abdominal aorta exhibits failure to taper of its caliber. There  is mural calcification but no evidence of an aneurysm. The patient  has undergone stent placement in the proximal common iliac arteries  bilaterally. There is no evidence of periaortic or peri-iliac  hemorrhage.    The stomach is nondistended. The non-opacified loops of small and  large bowel exhibit no evidence of ileus nor of obstruction. There  is sigmoid diverticulosis without evidence of acute diverticulitis.  The partially distended urinary bladder is normal in appearance.The  uterus is surgically absent. No adnexal masses are demonstrated.  There is no inguinal nor significant umbilical hernia.    The lung bases exhibit mild compressive atelectasis posteriorly with  small amounts of pleural thickening or fluid posteriorly. The lumbar  vertebral bodies are preserved in height. There is disc space  narrowing at L4-5 with posterior hard disc bulging. The bony pelvis  exhibits no evidence of an acute fracture nor lytic or blastic  lesion. There is mild degenerative change of the right hip.    There is  no evidence of a subcutaneous or deeper hematoma. Minimally  increased  density in the subcutaneous fat over the hips bilaterally  is nonspecific.     IMPRESSION:  1. There is no evidence of an intraperitoneal or retroperitoneal  hemorrhage. No significant abnormality within the subcutaneous or  deeper soft tissues over the abdomen or pelvis is demonstrated.  2. There is no evidence of acute hepatobiliary abnormality.  3. There are chronic changes within the kidneys with left renal  atrophy and bilateral nonobstructing stones. Mildly increased  density in the perinephric fat on the right is nonspecific. There is  no evidence of obstruction.  4. There is diverticulosis of the sigmoid colon but there is no  evidence of bowel obstruction or ileus.  5. There is mild atelectasis and minimal pleural thickening/fluid in  the posterior costophrenic gutters bilaterally.      Electronically Signed    By: David  [place]    On: 01/07/2014 15:56         Verified By: DAVID A. [place], M.D., MD   Assessment/Plan:  Invasive Device Daily Assessment of Necessity:  Does the patient currently have any of the following indwelling devices? none   Assessment/Plan:  Assessment IMP S/P TEE/ veg Aypical CP SOB COPD HTN AoVR Bacteremia Anemia Elevated troponin .   Plan PLAN Long term antibx IV for veg on bioprosthetic valve Agree with pic line Antbx for pul infection Agree with transfusion as necessary D/C Heparin Hold ASA for now Agree with transfuse 8/24 Agree with Antibx IV Doubt MI Increas activity Inhalers for COPD 02 [place] supplemental Consider GI w/u   Electronic Signatures: Dorothyann Peng D (MD)  (Signed 15-Jan-15 00:25)  Authored: Chief Complaint, VITAL SIGNS/ANCILLARY NOTES, Brief Assessment, Lab Results, Radiology Results, Assessment/Plan   Last Updated: 15-Jan-15 00:25 by Dorothyann Peng D (MD)

## 2015-04-23 NOTE — Consult Note (Signed)
Brief Consult Note: Diagnosis: Right non-displaced distal radius fracture.   Comments: I am aware of this consult.  I have reviewed the xrays.  The patient will require cast treatment for her injury.  I will place cast on patient tomorrow.  Electronic Signatures: Juanell FairlyKrasinski, Spurgeon Gancarz (MD)  (Signed 07-Jan-15 18:35)  Authored: Brief Consult Note   Last Updated: 07-Jan-15 18:35 by Juanell FairlyKrasinski, Maliya Marich (MD)

## 2015-04-23 NOTE — Discharge Summary (Signed)
PATIENT NAME:  Lori Cooke, Lori Cooke MR#:  [id number] DATE OF BIRTH:  [birth year]  DATE OF ADMISSION:  01/05/2014 DATE OF DISCHARGE:  01/15/2014  DISCHARGE SUMMARY ADDENDUM  Interim discharge summary dictated by Dr. Luberta MutterKonidena on 01/14/2014. Please refer to this dictation as well.   DISCHARGE DIAGNOSES:  1.  Dyspnea.  2.  Chronic obstructive pulmonary disease exacerbation, mild. 3.  Diastolic congestive heart failure, resolving now.  4.  Type II non-Q-wave myocardial infarction due to demand ischemia. 5.  A history of chronic respiratory failure on 2 L of oxygen through nasal cannula at home.  6.  Enterococcal sepsis on arrival.  7.  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  8.  Anemia, status post 1 unit of packed red blood cells transfusion. 9.  Right distal radius fracture after fall.  10.  Depression and generalized weakness. 11.  A history of hypertension.  12.  Aortic valve replacement. 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  13.  Chronic obstructive pulmonary disease.  14.  Chronic smoker.  15.  A history of coronary artery disease status post coronary artery bypass grafting,  16.  Hyperlipidemia.  17.  A history of aortic aneurysm. 18.  Neuropathy. 19.  Diabetes mellitus, not on any medications, diet controlled.  20.  Anxiety, depression.  21.  Seizure disorder. 22. Left carotid stenosis.  PAST SURGICAL HISTORY: 1.  Status post hysterectomy.  2.  Coronary artery bypass grafting. 3.  Aortic valve replacement.  4.  Gallbladder surgery. 5.  Goiter surgery.   DISCHARGE CONDITION:  Stable.   DISCHARGE MEDICATIONS:  The patient is to continue:  1.  Plavix 75 mg p.o. daily.  2.  Pantoprazole 40 mg p.o. daily.  3.  Gabapentin 300 mg 2 capsules every 8 hours.  4.  Crestor 10 mg p.o. at bedtime. 5.  Seroquel 100 mg p.o. at bedtime.  6.   Metoprolol succinate 25 mg p.o. daily.  7.  Advair Diskus 250/50, 1 puff twice daily.  8.  Diltiazem extended release 180 mg p.o. daily.  9.  Ventolin FHA 2 puffs 4 times daily as needed.  10.  Lexapro 10 mg p.o. daily.  11.  Theophylline extended-release 100 mg p.o. twice daily.  12.  Furosemide 20 mg p.o. daily.  13.  Docusate Senna 50/8.6 mg 1 tablet once daily as needed.  14.  Prednisone 10 mg tablets 2 tablets once daily for 2 days, then taper x 10 mg every 2 days until stopped.  15.  Albuterol ipratropium 2.5/0.5 mg 1 inhalation 6 times daily as needed.  16.  Ampicillin 2 grams every 4 hours through PICC line.  17.  Epinephrine 0.3 mg intramuscularly once as needed for severe allergic reaction.  18.  Line flushes with normal saline as well as heparin injection as needed.  19.  Azithromycin 580 mg every 12 hours.  ANTIBIOTICS:  Streptomycin as well as ampicillin should be continued for 6 weeks, at least.   DISCHARGE INSTRUCTIONS:  The patient is to follow up with Dr. Sampson GoonFitzgerald or any ID physician in the next 2 to 4 weeks after discharge. Also, orthopaedic surgeon, Dr. Martha ClanKrasinski, in 4 weeks and the patient's primary care physician, Dr. Marguerite OleaMoffett, in 1 week after discharge.   The patient is to have streptomycin infused over 60 minutes, not over 30 minutes. Also the patient is getting streptomycin at 4:00 a.m. as well as 4:00 p.m.  schedule. The patient should have her streptomycin trough level checked at 3:30 a.m. on 01/18/2014, peak level at 5:30 a.m. on 01/18/2014. Sodium creatinine should be checked every 48 hours or more frequently if needed. A clinical pharmacy consult as soon as possible. Dosing should be ensured to be adequate for endocarditis.   The patient is to continue oxygen at 3 L of oxygen per nasal cannula.   DIET:  2 grams salt, low fat, low cholesterol, regular consistency, diabetic diet or carbohydrate-controlled diet, regular consistency.  The patient is referred to  physical therapy.   OTHER COMMENTS:  The patient's CBC as well as comprehensive metabolic panel should be checked at least weekly as well as streptomycin levels per Pharmacy recommendations.   On the day of discharge, 01/15/2014, the patient felt satisfactory and did not complain of significant discomfort. She admits of having some mild shortness of breath; however, that was no significant, mostly on exertion.   Her vitals were stable with temperature of 98.9, pulse was 70s and the patient was in sinus rhythm. The patient's respiration rate was 16 to 18, blood pressure ranging from 101 systolic to 118 systolic and 60s to 70s diastolic, O2 sats were 97% on 2 L of oxygen through nasal cannula at rest.   It was felt that the patient is stable to be discharged to a skilled nursing facility for further management of her chronic medical problems. In regards to endocarditis, she is to continue antibiotic therapy for at least 6 weeks. Antibiotic therapy may be prolonged per Dr. Jarrett Ables or ID physician's recommendations. Dr. Jarrett Ables cellular telephone number is [phone], if any recommendations, otherwise.   TIME SPENT:  40 minutes on this patient.   ____________________________ Katharina Caper, MD rv:jm D: 01/15/2014 10:31:29 ET T: 01/15/2014 11:02:30 ET JOB#: [id number]  cc: Katharina Caper, MD, <Dictator> Aarya Quebedeaux MD ELECTRONICALLY SIGNED 01/22/2014 14:14

## 2015-04-23 NOTE — Consult Note (Signed)
Chief Complaint:  Subjective/Chief Complaint Mliss Sax well. Denies painmild sob   VITAL SIGNS/ANCILLARY NOTES: **Vital Signs.:   14-Jan-15 09:15  Vital Signs Type Pre Medication  Pulse Pulse 79  Systolic BP Systolic BP 614  Diastolic BP (mmHg) Diastolic BP (mmHg) 67  Mean BP 84  *Intake and Output.:   14-Jan-15 11:36  Grand Totals Intake:   Output:  700    Net:  -19 24 Hr.:  -1100  Urine ml     Out:  700  Urinary Method  Void; [facility]  Rehab Summary:   14-Jan-15 10:48  Rehab Progress Summary Rehab Progress Summary Physical Therapy:  S: "I'm hurting really bad today."  A: Pt is making good progress towards goals, however is self-limiting secondary to back pain. Therapist tries to find relieving positions for pain including sitting in recliner, however pt refuses. Pt very irritable this session, only participatory with mod cues. Pt able to perform limited ambulation with no loss of balance and ther-ex with good endurance.  P: Continue with POC to progress strength, ambulation, and functional mobility.  Gait Training Comments 20'-Pt ambulated using no assistive device, however does hold onto bed rails. Pt limps on R lower extremity secondary to pain. Therapist suggested pt to use QC, however pt discards it and holds onto furniture. No loss of balance noted, however pt fatigues with exertion. O2 donned for all mobility.  Anticipated Discharge Disposition  home with home health; SNF/STR; dependent on progress/participation with therapy, may be able to go home   Brief Assessment:  GEN well developed, well nourished, no acute distress   Cardiac Regular  murmur present  -- LE edema  -- JVD  --Gallop   Respiratory normal resp effort  clear BS  rhonchi   Gastrointestinal Normal   Gastrointestinal details normal Soft  Nontender  Nondistended  No masses palpable  Bowel sounds normal   EXTR negative cyanosis/clubbing, negative edema   Lab Results: LabObservation:  13-Jan-15  08:10   OBSERVATION Reason for Test  TDMs:  13-Jan-15 02:04   Vancomycin, Trough LAB 17 (Result(s) reported on 12 Jan 2014 at 02:58AM.)  Routine Micro:  13-Jan-15 13:58   Micro Text Report BLOOD CULTURE   COMMENT                   NO GROWTH IN 18-24 HOURS   ANTIBIOTIC                       Culture Comment NO GROWTH IN 18-24 HOURS  Result(s) reported on 13 Jan 2014 at 02:01PM.  Specimen Source left hand    15:46   Micro Text Report BLOOD CULTURE   COMMENT                   NO GROWTH IN 18-24 HOURS   ANTIBIOTIC                       Culture Comment NO GROWTH IN 18-24 HOURS  Result(s) reported on 13 Jan 2014 at 04:01PM.  Cardiology:  13-Jan-15 08:10   TEE REASON FOR EXAM:     COMMENTS:     PROCEDURE: [facility] - ECHO TRANSESOPHAGEAL  - Jan 12 2014  8:10AM   RESULT: Transesophageal Echocardiogram Report  Patient Name:   Lori Cooke Date of Exam: 01/12/2014 Medical Rec #:  [id number]       Custom1: Date of Birth:  09-25-1951    Height: Patient Age:  64 years     Weight: Patient Gender: F            BSA:  Indications: Endocarditis Sonographer:    Sherrie Sport RDCS Referring Phys: Vaughan Basta SPECTRAL DOPPLER ANALYSIS (where applicable):  PROCEDURE: After discussion of the risks and benefits of the TEE, an  informed consent was obtained. Local oropharyngeal anesthetic was  provided with Benzocaine spray and viscous lidocaine. Intravenous  conscious sedation was provided with 32m Midazolam and 125 mcg Fentanyl.  The TEE probe was passed without difficulty. Imaged were obtained with  the patient in a left lateral decubitus position. Image quality was  excellent. The patient's vital signs; including heart rate, blood  pressure, and oxygen saturation; remained stable throughout the  procedure. The patient tolerated the procedure well and without  complications.  PHYSICIAN INTERPRETATION: Left Ventricle: LV septal wall thickness was normal. LV posterior wall  thickness was  normal. Global LV systolic function was normal. Left  ventricular ejection fraction, by visual estimation, is 60 to 65%. Left Atrium: The left atrium is normal in size. Right Atrium: The right atrium is normal in size. Mitral Valve: A pedunculated and mobile and moderate vegetation is seen  on the anterior mitral leaflet. The MV vegetation measures 1 mm x 1 mm.  Mild mitral valve regurgitation is seen. The MR jet is centrally-directed. Aortic Valve: Echo findings are consistent with vegetation of the aortic  prosthesis. Shunts: Agitated saline contrast was given intravenously to evaluate for  intracardiac shunting. Saline contrast bubble study was negative, with no  evidence of any intracardiac shunt.  Summary:  1. Left ventricular ejection fraction, by visual estimation, is 60 to  65%.  2. Normal global left ventricular systolic function.  3. Mild mitral valve regurgitation.  4. Pedunculated and mobile and moderate vegetation on the mitral valve.  5. Abnormalvegetation of the aortic prosthesis.  6. 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  7. Saline contrast bubble study was negative, with no evidence of any  intracardiac shunt. 1BementMD Electronically signed by [id number] BillMD Signature Date/Time: 01/12/2014/4:52:43 PM *** Final ***  IMPRESSION: .  Verified By: KTeodoro Spray M.D., MD  Routine Chem:  13-Jan-15 02:04   Glucose, Serum 90  BUN 18  Creatinine (comp) 0.97  Sodium, Serum  134  Potassium, Serum 4.4  Chloride, Serum 102  CO2, Serum 30  Calcium (Total), Serum 8.6  Anion Gap  2  Osmolality (calc) 270  eGFR (African American) >60  eGFR (Non-African American) >60 (eGFR values <641mmin/1.73 m2 may be an indication of chronic kidney disease (CKD). Calculated eGFR is useful in patients with stable renal function. The eGFR calculation will not be reliable in acutely ill patients when serum creatinine is changing rapidly. It is not useful in   patients on dialysis. The eGFR calculation may not be applicable to patients at the low and high extremes of body sizes, pregnant women, and vegetarians.)  Routine Hem:  13-Jan-15 02:04   WBC (CBC)  19.0  RBC (CBC)  2.79  Hemoglobin (CBC)  8.1  Hematocrit (CBC)  24.3  Platelet Count (CBC) 238  MCV 87  MCH 29.2  MCHC 33.5  RDW 14.2  Neutrophil % 84.0  Lymphocyte % 12.0  Monocyte % 3.8  Eosinophil % 0.0  Basophil % 0.2  Neutrophil #  16.0  Lymphocyte # 2.3  Monocyte # 0.7  Eosinophil # 0.0  Basophil # 0.0 (Result(s) reported on 12 Jan 2014 at 02:56AM.)   Radiology  Results: XRay:    06-Jan-15 16:41, Chest Portable Single View  Chest Portable Single View   REASON FOR EXAM:    shortness of breath; chest pain  COMMENTS:       PROCEDURE: DXR - DXR PORTABLE CHEST SINGLE VIEW  - Jan 05 2014  4:41PM     CLINICAL DATA:  Shortness of breath.  Chest pain.  Fall.    EXAM:  PORTABLE CHEST - 1 VIEW    COMPARISON:DG CHEST 2V dated 12/13/2013; DG CHEST 2V dated  12/11/2013; DG CHEST 2V dated 12/07/2013; CT CHEST W/ CM dated  11/22/2008    FINDINGS:  Mild cardiomegaly with cardiothoracic index of 58%. Mildly tortuous  thoracic aorta. Prior median sternotomy.    The lungs appear clear. No pneumothorax. No pleural effusion  observed. 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     IMPRESSION:  1. Mildly enlarged cardiopericardial silhouette.  2. 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      Electronically Signed    By: Sherryl Barters M.D.    On: 01/05/2014 16:52     Verified By: Carron Curie, M.D.,    06-Jan-15 16:41, Forearm Right  Forearm Right   REASON FOR EXAM:    s/p fall; edema/pain  COMMENTS:       PROCEDURE: DXR - DXR FOREARM RIGHT  - Jan 05 2014  4:41PM     CLINICAL DATA:  Fall.  Forearm edema and pain.    EXAM:  RIGHT FOREARM - 2 VIEW    COMPARISON:  DG HAND COMPLETE 3+V*R* dated  01/05/2014    FINDINGS:  There is a fracture of the distal radius medially involving the  distal metaphysis and believed to be extending into the distal  radioulnar joint and possibly the distal radial articular surface  laterally.    I do not observe an ulnar fracture.  No elbow joint effusion.     IMPRESSION:  1. Fracture of the distal radius medially, likely extending into the  distal radioulnar joint and potentially the distal radial articular  surface.      Electronically Signed    By: Sherryl Barters M.D.    On: 01/05/2014 17:00     Verified By: Carron Curie, M.D.,    06-Jan-15 16:41, Hand Right Complete  Hand Right Complete   REASON FOR EXAM:    s/p fall; edema/pain  COMMENTS:       PROCEDURE: DXR - DXR HAND RT COMPLETE W/OBLIQUES  - Jan 05 2014  4:41PM     CLINICAL DATA:  Fall.  Edema and pain in the hand.    EXAM:  RIGHT HAND - COMPLETE 3+ VIEW    COMPARISON:  None.    FINDINGS:  Subtle distal radial fracture is just about occult on these  radiographs, but is better shown on the forearm radiographs obtained  concurrently. No additional fracture is observed.     IMPRESSION:  1. Distal radial fracture medially, with suspected extension into  the distal radial ulnar joint and possibly the distal radial  articular surface. This fracture is nearly occult on this exam, but  is better shown on the dedicated forearm radiographs.      Electronically Signed    By: Sherryl Barters M.D.    On: 01/05/2014 16:54         Verified By: Carron Curie, M.D.,    07-Jan-15 13:31, Chest PA and Lateral  Chest PA and Lateral   REASON  FOR EXAM:    chf  COMMENTS:       PROCEDURE: DXR - DXR CHEST PA (OR AP) AND LATERAL  - Jan 06 2014  1:31PM     CLINICAL DATA:  History of CHF now with dyspnea    EXAM:  CHEST  2 VIEW    COMPARISON:  Portable chest x-ray January 05, 2014    FINDINGS:  The lungs are well-expanded. The interstitial markings are  somewhat  coarse. The central pulmonary vascularity is prominent. On the  lateral film there are coarse lung markings in the retrocardiac  region likely bilaterally. A prominent crush-type prosthetic valve  in the aortic position is demonstrated. There is no significant  pleural effusion. The patient has undergone previous kyphoplasty for  a mid upper vertebral body compression. The patient has 5 intact  sternal wires demonstrated.     IMPRESSION:  The findings may reflect low-grade interstitial edema of cardiac  cause. Overall there has not been dramatic interval change since the  previous study. Atelectasis or developing pneumonia in the lower  lobes on the lateral film cannotbe excluded.      Electronically Signed    By: David  Martinique    On: 01/06/2014 13:57         Verified By: DAVID A. [place]ique, M.D., MD    09-Jan-15 19:22, Knee Right Complete  Knee Right Complete   REASON FOR EXAM:    s/p fall and patient c/o right knee pain  COMMENTS:       PROCEDURE: DXR - DXR KNEE RT COMP WITH OBLIQUES  - Jan 08 2014  7:22PM     CLINICAL DATA:  Fall with right knee pain.    EXAM:  RIGHT KNEE - COMPLETE 4+ VIEW    COMPARISON: None    FINDINGS:  There is no evidence of acute fracture, subluxation or dislocation.  There is no evidence of joint effusion.    Mild sclerotic irregularity of the proximal tibia may represent an  infarct.    No other focal bony abnormalities are present.    The joint spaces are otherwise unremarkable.     IMPRESSION:  No evidence of acute abnormality.      Electronically Signed    By: Hassan Rowan M.D.    On: 01/08/2014 19:37         Verified By: Lura Em, M.D.,    10-Jan-15 15:47, Chest 1 View AP or PA  Chest 1 View AP or PA   REASON FOR EXAM:    verify PICC placement correct  COMMENTS:       PROCEDURE: DXR - DXR CHEST 1 VIEWAP OR PA  - Jan 09 2014  3:47PM     CLINICAL DATA:  PICC line placement    EXAM:  CHEST - 1  VIEW    COMPARISON:  01/06/2014    FINDINGS:  Cardiomediastinal silhouette is stable. Mild interstitial prominence  bilaterally again noted. There is left arm PICC line with tip in SVC  right atrium junction. No diagnostic pneumothorax. No segmental  infiltrate.     IMPRESSION:  Left arm PICC line with tip in SVC right atrium junction. No  diagnostic pneumothorax.      Electronically Signed    By: Lahoma Crocker M.D.    On: 01/09/2014 15:58         Verified By: Ephraim Hamburger, M.D.,  Cardiology:    06-Jan-15 16:18, ED ECG  Ventricular Rate 93  Atrial Rate  93  P-R Interval 138  QRS Duration 84  QT 360  QTc 447  P Axis 71  R Axis 60  T Axis 69  ECG interpretation   Normal sinus rhythm  Possible Left atrial enlargement  ST & T wave abnormality, consider anterior ischemia  Abnormal ECG  When compared with ECG of 07-Dec-2013 10:47,  Non-specific change in ST segment in Inferior leads  T wave inversion now evident in Anterior leads  ----------unconfirmed----------  Confirmed by OVERREAD, NOT ([id number]), editor PEARSON, BARBARA ([id number]) on 01/08/2014 8:39:01 AM  ED ECG     07-Jan-15 09:42, Echo Doppler  Echo Doppler   REASON FOR EXAM:      COMMENTS:       PROCEDURE: [facility] - ECHO DOPPLER COMPLETE(TRANSTHOR)  - Jan 06 2014  9:42AM     RESULT: Echocardiogram Report    Patient Name:   Lori Cooke Date of Exam: 01/06/2014  Medical Rec #:  [id number]       Custom1:  Date of Birth:  06-25-1951    Height:       65.0 in  Patient Age:    69 years     Weight:       187.4 lb  Patient Gender: F            BSA:          1.92 m??    Indications: CHF  Sonographer:    Sherrie Sport RDCS  Referring Phys: Vaughan Basta    Sonographer Comments: Echo stopped after apicals-per pt request- felt   like she was going to vomit.    Summary:   1. Left ventricular ejection fraction, by visual estimation, is 60 to   65%.   2. Normal global left ventricular systolic function.   3. Mildly dilated left  atrium.   4. Mild mitral valve regurgitation.   5. Moderate thickening and calcification of the anterior and posterior   mitral valve leaflets.   6. Myxomatous mitral valve.   7. Mildly elevated pulmonary artery systolic pressure.   8. Mild to moderate tricuspid regurgitation.   9. Moderately increased left ventricular posterior wall thickness.  2D AND M-MODE MEASUREMENTS (normal ranges within parentheses):  Left Ventricle:          Normal  IVSd (2D):      1.26 cm (0.7-1.1)  LVPWd (2D):     1.40 cm (0.7-1.1) Aorta/LA:                  Normal  LVIDd (2D):     4.51 cm (3.4-5.7) Aortic Root (2D): 3.50 cm (2.4-3.7)  LVIDs (2D):     2.75 cm           Left Atrium (2D): 4.20 cm (1.9-4.0)  LV FS (2D):     39.0 %   (>25%)  LV EF (2D):     69.6 %   (>50%)                                    Right Ventricle:                                    RVd (2D):        5.00 cm  LV DIASTOLIC FUNCTION:  MV Peak E: 0.90 m/s E/e' Ratio: 12.40  MV Peak A: 1.18  m/s Decel Time: 335 msec  E/A Ratio: 0.77  SPECTRAL DOPPLER ANALYSIS (where applicable):  Mitral Valve:  MV P1/2 Time: 97.15 msec  MV Area, PHT: 2.26 cm??  Aortic Valve: AoV Max Vel: 2.80 m/s AoV Peak PG: 31.4 mmHg AoV Mean PG:   19.0 mmHg  LVOT Vmax: 0.90 m/s LVOT VTI: 0.232 m LVOT Diameter: 2.10 cm  AoV Area, Vmax: 1.11 cm?? AoV Area, VTI: 1.43 cm?? AoV Area, Vmn: 1.02 cm??  Tricuspid Valve and PA/RV Systolic Pressure: TR Max Velocity: 3.03 m/s RA   Pressure: 5 mmHg RVSP/PASP: 41.7 mmHg  Pulmonic Valve:  PV Max Velocity: 1.22 m/s PV Max PG: 6.0 mmHg PV Mean PG:    PHYSICIAN INTERPRETATION:  Left Ventricle: The left ventricular internal cavity size was normal. LV   septal wall thickness was normal. LV posterior wall thickness was   moderately increased. Global LV systolic function was normal. Left   ventricular ejection fraction, by visual estimation, is 60 to 65%.  Right Ventricle: The right ventricular size is normal. Global RV systolic    function is normal.  Left Atrium: The left atrium is mildly dilated.  Right Atrium: The right atrium is normal in size.  Pericardium: There is no evidence of pericardial effusion.  Mitral Valve: The mitral valve is myxomatous. There is moderate   thickening and calcification of the anterior and posterior mitral valve   leaflets. Mild mitral valve regurgitation is seen. Can not r/o veg vs Ca#.  Tricuspid Valve: The tricuspid valve is normal. Mild to moderate   tricuspid regurgitation is visualized. The tricuspid regurgitant velocity   is 3.03 m/s, and with an assumed right atrial pressure of 5 mmHg, the     estimated right ventricular systolic pressure is mildly elevated at 41.7   mmHg.  Aortic Valve: The aortic valve is normal. No evidence of aortic valve   regurgitation is seen.  Pulmonic Valve: The pulmonic valve is normal.    [place] MD  Electronically signed by [place] MD  Signature Date/Time: 01/06/2014/11:14:43 AM    *** Final ***    IMPRESSION: .      Verified By: Yolonda Kida, M.D., MD    13-Jan-15 08:10, TEE  TEE   REASON FOR EXAM:      COMMENTS:       PROCEDURE: [facility] - ECHO TRANSESOPHAGEAL  - Jan 12 2014  8:10AM     RESULT: Transesophageal Echocardiogram Report    Patient Name:   Lori Cooke Date of Exam: 01/12/2014  Medical Rec #:  [id number]       Custom1:  Date of Birth:  06/19/1951    Height:  Patient Age:    64 years     Weight:  Patient Gender: F            BSA:    Indications: Endocarditis  Sonographer:    Sherrie Sport RDCS  Referring Phys: Vaughan Basta  SPECTRAL DOPPLER ANALYSIS (where applicable):    PROCEDURE: After discussion of the risks and benefits of the TEE, an   informed consent was obtained. Local oropharyngeal anesthetic was   provided with Benzocaine spray and viscous lidocaine. Intravenous   conscious sedation was provided with 16m Midazolam and 125 mcg Fentanyl.   The TEE probe was passed without  difficulty. Imaged were obtained with   the patient in a left lateral decubitus position. Image quality was   excellent. The patient's vital signs; including heart rate, blood   pressure, and  oxygen saturation; remained stable throughout the   procedure. The patient tolerated the procedure well and without   complications.    PHYSICIAN INTERPRETATION:  Left Ventricle: LV septal wall thickness was normal. LV posterior wall   thickness was normal. Global LV systolic function was normal. Left   ventricular ejection fraction, by visual estimation, is 60 to 65%.  Left Atrium: The left atrium is normal in size.  Right Atrium: The right atrium is normal in size.  Mitral Valve: A pedunculated and mobile and moderate vegetation is seen   on the anterior mitral leaflet. The MV vegetation measures 1 mm x 1 mm.   Mild mitral valve regurgitation is seen. The MR jet is centrally-directed.  Aortic Valve: Echo findings are consistent with vegetation of the aortic   prosthesis.  Shunts: Agitated saline contrast was given intravenously to evaluate for   intracardiac shunting. Saline contrast bubble study was negative, with no   evidence of any intracardiac shunt.    Summary:   1. Left ventricular ejection fraction, by visual estimation, is 60 to   65%.   2. Normal global left ventricular systolic function.   3. Mild mitral valve regurgitation.   4. Pedunculated and mobile and moderate vegetation on the mitral valve.   5. Abnormalvegetation of the aortic prosthesis.   6. 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   7. Saline contrast bubble study was negative, with no evidence of any   intracardiac shunt.  [place] MD  Electronically signed by [id number] Bartholome Bill MD  Signature Date/Time: 01/12/2014/4:52:43 PM  *** Final ***    IMPRESSION: .    Verified By: Teodoro Spray, M.D., MD  CT:    08-Jan-15 15:40, CT Abdomen and Pelvis Without Contrast  CT Abdomen and Pelvis Without Contrast    REASON FOR EXAM:    (1) drop in hgb and lower abd pain. eval for intraabd   bleed; (2) drop in hgb and  COMMENTS:       PROCEDURE: CT  - CT ABDOMEN AND PELVIS W0  - Jan 07 2014  3:40PM     CLINICAL DATA:  On set of lower abdominal discomfort today, drop in  hemoglobin level    EXAM:  CT ABDOMEN AND PELVIS WITHOUT CONTRAST    TECHNIQUE:  Multidetector CT imaging of the abdomen and pelvis was performed  following the standard protocol without intravenous contrast.  COMPARISON:  None.    FINDINGS:  The liver exhibits no focal mass nor ductal dilation. The  gallbladder is surgically absent. The spleen exhibits normal density  and contour and does not exceed 11.2 cm in greatest dimension. The  pancreas, adrenal glands, and kidneys exhibit no acute  abnormalities. The left kidney is atrophic and exhibits several 1-2  mm diameter nonobstructing upper pole stones. There is a 2 mm  diameter midpole nonobstructing stone on the right. There is a small  amount of increased density in the perinephric fat on the right  without evidence of a perinephric hematoma. There is no free fluid  in the abdomen or pelvis. The psoas musculature is normal in density  and contour with no evidence of a hematoma.  The abdominal aorta exhibits failure to taper of its caliber. There  is mural calcification but no evidence of an aneurysm. The patient  has undergone stent placement in the proximal common iliac arteries  bilaterally. There is no evidence of periaortic or peri-iliac  hemorrhage.    The stomach is nondistended. The  non-opacified loops of small and  large bowel exhibit no evidence of ileus nor of obstruction. There  is sigmoid diverticulosis without evidence of acute diverticulitis.  The partially distended urinary bladder is normal in appearance.The  uterus is surgically absent. No adnexal masses are demonstrated.  There is no inguinal nor significant umbilical hernia.    The lung bases  exhibit mild compressive atelectasis posteriorly with  small amounts of pleural thickening or fluid posteriorly. The lumbar  vertebral bodies are preserved in height. There is disc space  narrowing at L4-5 with posterior hard disc bulging. The bony pelvis  exhibits no evidence of an acute fracture nor lytic or blastic  lesion. There is mild degenerative change of the right hip.    There is no evidence of a subcutaneous or deeper hematoma. Minimally  increased density in the subcutaneous fat over the hips bilaterally  is nonspecific.     IMPRESSION:  1. There is no evidence of an intraperitoneal or retroperitoneal  hemorrhage. No significant abnormality within the subcutaneous or  deeper soft tissues over the abdomen or pelvis is demonstrated.  2. There is no evidence of acute hepatobiliary abnormality.  3. There are chronic changes within the kidneys with left renal  atrophy and bilateral nonobstructing stones. Mildly increased  density in the perinephric fat on the right is nonspecific. There is  no evidence of obstruction.  4. There is diverticulosis of the sigmoid colon but there is no  evidence of bowel obstruction or ileus.  5. There is mild atelectasis and minimal pleural thickening/fluid in  the posterior costophrenic gutters bilaterally.      Electronically Signed    By: David  Martinique    On: 01/07/2014 15:56         Verified By: DAVID A. [place], M.D., MD   Assessment/Plan:  Invasive Device Daily Assessment of Necessity:  Does the patient currently have any of the following indwelling devices? none   Assessment/Plan:  Assessment IMP Endocarditis S/P TEE/ veg Aypical CP SOB COPD HTN AoVR Bacteremia Anemia Elevated troponin .   Plan PLAN No evidence that valve surgery is indicated Long term antibx IV for veg on bioprosthetic valve Agree with pic line Antbx for pul infection Will need f/u bld cultures Agree with transfusion as necessary D/C  Heparin Hold ASA for now Agree with transfuse 8/24 Agree with Antibx IV Doubt MI Increase activity Inhalers for COPD 02 [place] supplemental Consider GI w/u possible CT   Electronic Signatures: Yolonda Kida (MD)  (Signed 15-Jan-15 00:31)  Authored: Chief Complaint, VITAL SIGNS/ANCILLARY NOTES, Brief Assessment, Lab Results, Radiology Results, Assessment/Plan   Last Updated: 15-Jan-15 00:31 by Lujean Amel D (MD)

## 2015-04-23 NOTE — Consult Note (Signed)
PATIENT NAME:  Lori Cooke, Lori Cooke MR#:  [id number] DATE OF BIRTH:  1951/05/08  CARDIAC CONSULTATION  DATE OF CONSULTATION:  01/06/2014  PRIMARY CARE PHYSICIAN:  Dr. Marguerite OleaMoffett  CONSULTING PHYSICIAN:  Maxen Rowland D. Nivedita Mirabella, MD  INDICATION: Shortness of breath, possible angina, elevated troponin.   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 The patient has had chronic COPD on 2 liters, COPD, chronic smoker, aortic aneurysm, coronary artery disease and coronary artery bypass surgery, hyperlipidemia who was admitted to the hospital for acute respiratory failure COPD.  She has had some chest pain, tightness, congestion. The patient continued having significant problems. She is weak and short of breath, fell down 4 times in the past week. She injured her right arm with fracture. The patient presented again with shortness of breath, chest pain, congestion and elevated troponin and so cardiology was consulted.  REVIEW OF SYSTEMS:  Multiple falls. No clear blackout spells or syncope. No weight loss. No weight gain. No hemoptysis or hematemesis. No bright red blood per rectum. No vision change or hearing change. Denied sputum production or cough. She has had chest pain and congestion, weakness and fatigue.   PAST MEDICAL HISTORY: Hypertension, aortic valve replacement, COPD, aortic aneurysm, coronary artery disease, hyperlipidemia, neuropathy, diabetes, anxiety, depression, seizure disorder, carotid stenosis.   PAST SURGICAL HISTORY: Hysterectomy, coronary artery bypass surgery, aortic valve replacement and gallbladder goiter.   FAMILY HISTORY: Diabetes, hypertension, depression.   SOCIAL HISTORY: Low history of smoking. Denies alcohol consumption. Lives alone.   MEDICATIONS:  Ventolin inhalers q.i.d., Spiriva once a day, theophylline 100 twice a day, quetiapine 100 mg once a day, polyethylene  glycol 17 grams once a day, Plavix 75 mg a day, Protonix 40 a day, metoprolol 25 once a day, magnesium oxide 400 mg 1 to 2 tablets daily, Lexapro 10 a day, Lasix 20 mg once a day, Cardizem 180 once a day, Crestor 10 a day, Advair Diskus twice a day.   PHYSICAL EXAMINATION: VITAL SIGNS: Blood pressure 140/70, pulse 90, respiratory rate 18, afebrile.  HEENT: Normocephalic, atraumatic. Pupils equal, reactive to light. NECK:  Supple. No significant JVD, bruits or adenopathy.  LUNGS: Bilateral rhonchi, wheezing. No significant rales. Decreased air movement. Mild crepitus bilaterally.  HEART: Regular rate and rhythm. Significant murmur along the left sternal border.  ABDOMEN: Benign.  EXTREMITIES: Within normal limits.  NEUROLOGIC: Intact.  SKIN: Normal.   LABORATORIES: Glucose of 134, BNP was 9500, BUN 10, creatinine 1.6. Sodium 135, potassium 2.3, chloride 29, CO2 of 32, troponin 0.27. White count 15,000, hemoglobin 11.6, platelet count 166.  Chest x-ray:  Mild pericardial enlargement, otherwise unremarkable. Right arm shows a fracture of the distal radius.   IMPRESSION:  1.  Congestive heart failure,  2.  Chronic obstructive pulmonary disease.  3.  Possible non-Q-wave myocardial infarction.  4.  Smoking. 5.  Fracture. 6.  Falls. 7.  Positive blood cultures. 8.  Aortic valve replacement. 9.  Hypokalemia.  10.  Angina.   PLAN: Continue current medications. Agree with rule out for myocardial infarction. Follow up cardiac enzymes. Follow up EKGs. Recommend short-term anticoagulation.  . Recommend blood pressure control. Would consider echocardiogram for possible myocardial infarction with murmur and evaluation of aortic valve. Would continue to treat respiratory problems with inhalers. Consider steroid taper. Continue therapy for possible congestive heart failure. Continue therapy for right arm fracture. Advised the patient to quit smoking. Replace potassium. Would  consider broad-spectrum  antibiotics for possible bronchitis. Consider therapy for heart failure. Will consider functional study versus cardiac catheterization once the patient improves. We will hopefully await until the patient's respiratory status improves.   ____________________________ Bobbie Stack Juliann Pares, MD ddc:ce D: 01/06/2014 17:24:30 ET T: 01/06/2014 18:34:22 ET JOB#: [id number]  cc: Chakita Mcgraw D. Juliann Pares, MD, <Dictator> Alwyn Pea MD ELECTRONICALLY SIGNED 02/12/2014 12:19

## 2015-04-23 NOTE — H&P (Signed)
PATIENT NAME:  Lori Cooke, SAXTON MR#:  [id number] DATE OF BIRTH:  19-Feb-1951  DATE OF ADMISSION:  12/07/2013  PRIMARY CARE PHYSICIAN: Dr. Suzie Portela.   REFERRING PHYSICIAN: Dr. Cyril Loosen.   CHIEF COMPLAINT: Shortness of breath.   HISTORY OF PRESENT ILLNESS: The patient is a pleasant 64 year old female with chronic respiratory failure on 2 liters of oxygen around the clock, multiple inhalers. She had had of note for several hospitalizations in the last several months for COPD flares and shortness of breath. She came in after experiencing 2 to 3 days of significant shortness of breath, wheezing, cough, which is productive with whitish sputum without any fevers or chills, sick contacts or recent travels. She is significantly more short of breath than baseline and has significant dyspnea on exertion. She came into the hospital and has received multiple nebulizers and IV steroids and still has not felt better. Hospitalist services were contacted for further evaluation and management.   PAST MEDICAL HISTORY:  1. Hypertension.  2. Aortic valve replacement with porcine valve.  3. Chronic respiratory failure.  4. History of aortic aneurysm.  5. CAD  status post CABG.  6. Hyperlipidemia.  7. Neuropathy.  8. History of diabetes not on medications anymore.  9. Anxiety.  10. Depression.  11. History of seizure disorder.  12. History of left-sided carotid stenosis.   ALLERGIES: SULFA, PENICILLIN, MORPHINE, LATEX AND TAPE.   PAST SURGICAL HISTORY:  1. Hysterectomy.  2. She states she has had history of CABG. 3. Aortic valve replacement.  4. Gallbladder surgery. 5. Goiter surgery.   FAMILY HISTORY: Diabetes, hypertension, depression, aortic valve replacement.   SOCIAL HISTORY: 50+ year smoker. Has tried to cut down, smokes less than 1/2 pack a day. No alcohol or drug use.   REVIEW OF SYSTEMS:  CONSTITUTIONAL: No fever, fatigue, weight changes, but does have shortness of breath.  EYES: No blurry vision  or double vision.  ENT: No tinnitus, has decreased hearing.  RESPIRATORY: Positive for cough with whitish sputum, shortness of breath and dyspnea on exertion and wheezing.  CARDIOVASCULAR: No pleuritic chest pain, but she feels some chest tightness. No swelling in the legs. No arrhythmias.  GASTROINTESTINAL: No nausea, vomiting, diarrhea, abdominal pain, bloody stools or dark stools.  GENITOURINARY: Denies dysuria or hematuria.  HEMATOLOGIC AND LYMPHATIC: No anemia or easy bruising.  SKIN: No rashes.  MUSCULOSKELETAL: Has some back pain.  NEUROLOGIC: No focal weakness or numbness.  PSYCHIATRIC: Has anxiety and depression.   PHYSICAL EXAMINATION: VITAL SIGNS: Temperature on arrival was 98.2, pulse rate 79, respiratory 22, blood pressure 121/86, oxygen saturation 97% on oxygen.  GENERAL: The patient is an elderly-appearing, thin Caucasian female sitting in bed in moderate respiratory distress with audible wheezing.  HEENT: Normocephalic, atraumatic. Pupils are equal and reactive. Anicteric sclerae. Extraocular muscles intact. Moist mucous membranes.  NECK: Supple. No thyroid tenderness. No cervical lymphadenopathy.  CARDIOVASCULAR: S1, S2 unable to adequately auscultate heart sound as the patient's extensive wheezing overshadows the heart sounds.   LUNGS: Bilateral diffuse wheezing and decreased air entry. Wheezing is actually audible without a stethoscope.  ABDOMEN: Soft, nontender, nondistended. Positive bowel sounds in all quadrants.  EXTREMITIES: No significant lower extremity edema.  NEUROLOGIC: Cranial nerves II through XII grossly intact. Strength is five out of five in all extremities. Sensation intact to light touch.  PSYCHIATRIC: Awake, awake, alert, oriented x 3.   LABORATORY, DIAGNOSTIC AND RADIOLOGIC DATA: X-ray of the chest, no acute abnormalities. Alkaline phosphatase 202. Otherwise LFTs within normal limits. Troponin negative.  White count 7.2. Hemoglobin is 13.5, platelets are  2011, BUN 13, creatinine 0.86, sodium 141, potassium 3.5.   EKG: Normal sinus rhythm, rate is 80, nonspecific ST changes, do not see any ST elevations.   ASSESSMENT AND PLAN: We have a 64 year old with chronic respiratory failure on multiple nebulizers and inhalers with several admissions in the last several months who comes in with shortness of breath, productive cough with whitish sputum without any fevers or leukocytosis, but has audible wheezing without the stethoscope. She appears to have acute chronic obstructive pulmonary disease exacerbation with possible acute bronchitis. She would need hospitalization and around-the-clock nebulizers, oxygen, as well IV steroids. She does not appear to have pneumonia on x-ray of the chest. I would obtain sputum cultures. We would continue her on Spiriva as well. She was counseled again for her tobacco abuse. She states that she is trying to cut down, but at this point her anxiety level would increase if she attempts to stop. She states she would not want a nicotine patch, but oral inhaler. Nicotine was offered and she accepted. I would continue her calcium channel blocker, hold her beta blocker in the setting of extensive wheezing. Continue her gabapentin. I would start her on deep vein prophylaxis with heparin. Resume her magnesium as well. I would also put her on azithromycin.   The patient is FULL CODE.   Total time spent is 45 minutes.    ____________________________ Krystal EatonShayiq Amily Depp, MD sa:sg D: 12/07/2013 13:04:31 ET T: 12/07/2013 13:26:38 ET JOB#: [id number]  cc: Krystal EatonShayiq Jaquelyne Firkus, MD, <Dictator> Krystal EatonSHAYIQ Jese Comella MD ELECTRONICALLY SIGNED 01/12/2014 11:02

## 2015-04-23 NOTE — Consult Note (Signed)
PATIENT NAME:  Lori, Cooke MR#:  [id number] DATE OF BIRTH:  1951-06-14  DATE OF CONSULTATION:  01/08/2014  REFERRING PHYSICIAN:  Dr. Jacques Navy.  CONSULTING PHYSICIAN:  Stann Mainland. Sampson Goon, MD  REASON FOR CONSULTATION:  Enterococcal bacteremia.  HISTORY OF PRESENT ILLNESS:  This is a 64 year old female with a history of hypertension, bioprosthetic porcine valve, hypertension, COPD on home O2, as well as an aortic aneurysm, coronary artery disease, hyperlipidemia, who was admitted 01/06 with increasing shortness of breath and frequent falls. In the Emergency Room she was found to have an increased BNP, elevated troponins. She also had a hand injury. Blood cultures were done, and have grown enterococcus and further identification is pending on one set. She has been covered with antibiotics. A transthoracic echo was done, which did not show any evidence of a vegetation. Of note, she did have a white count of 15 on admission, but it has normalized.   I spoke with the patient. She says besides the recent admission for her difficulty breathing and COPD from 12/08 to the 15th, she had not been having fevers at home or weight loss.   PAST MEDICAL HISTORY:   1.  COPD.  2.  CHF.  3.  Valve replacement with a porcine valve 8 years ago.  4.  Aortic aneurysm.  5.  Coronary artery disease status post CABG.  6.  Hyperlipidemia.  7.  Diabetes.  8.  Neuropathy.  9.  Anxiety.  10.  Depression.  11.  A history of seizure disorder.  12.  Carotid stenosis.   PAST SURGICAL HISTORY: 1.  Hysterectomy.  2.  Coronary artery bypass surgery.  3.  Aortic valve replacement.  4.  Gallbladder surgery. 5.  Goiter surgery.  ALLERGIES:  PENICILLIN, SULFA, MORPHINE, TAPE AND LATEX.  FAMILY HISTORY:  Diabetes, hypertension, depression.   SOCIAL HISTORY:  The patient smoked for many years and is currently smoking. She is on home O2. She lives alone.   REVIEW OF SYSTEMS:  Eleven systems reviewed and negative except  as per HPI.    CURRENT ANTIBIOTICS: 1.  Vancomycin.  2.  She is also on prednisone and other medications are reviewed.   PHYSICAL EXAMINATION:  VITAL SIGNS:  T-max since admission is 99.2. Pulse 72, blood pressure 111/68, sat 90% on 2 L.  GENERAL:  Thin, dyspneic, no acute distress on O2.  HEENT:  Pupils equal, round and reactive to light and accommodation. Extraocular movements are intact. Sclerae anicteric.  OROPHARYNX:  Clear.  NECK:  Supple.  HEART:  Regular with a 2/6 systolic murmur.  LUNGS:  Coarse rhonchi bilaterally.  ABDOMEN:  Soft, nontender.  EXTREMITIES:  No edema.  SKIN:  No peripheral stigmata of endocarditis.   LABORATORY, DIAGNOSTIC AND RADIOLOGICAL DATA:  Labs are reviewed. White blood count has come down from 15 on admission. Blood cultures x 2 on January 6th have grown enterococcus in 2 of 2 cultures. It is sensitive to ampicillin and linezolid. Echocardiogram done did not show any evidence of vegetation.   IMPRTESSION:  A 64 year old female admitted after a fall with a history of chronic obstructive pulmonary disease as well as bioprosthetic valve, who has enterococcal bacteremia. I do think there is a relatively high risk of endocarditis in this patient given the prosthetic valve and the finding of atypical organism like enterococcus. I do not see a urine sample done on admit so I am not clear if it could have been from a urinary source. I did review blood cultures going  back several months and those were negative.   I would suggest continuing the patient on vancomycin as she is ALLERGIC TO PENICILLIN. We could consider challenging her with penicillin but at this point, I think we need to evaluate her for the possibility of endocarditis.   I would like to observe her, repeat blood cultures over the weekend and then consider doing a transesophageal echocardiogram on Monday to evaluate for endocarditis.   Thank you for the consult. I will  be glad to follow with you.    ____________________________ Stann Mainlandavid P. Sampson GoonFitzgerald, MD dpf:jm D: 01/08/2014 16:48:15 ET T: 01/08/2014 17:26:29 ET JOB#: [id number]  cc: Stann Mainlandavid P. Sampson GoonFitzgerald, MD, <Dictator> Francis Doenges Sampson GoonFITZGERALD MD ELECTRONICALLY SIGNED 01/08/2014 22:17

## 2015-04-23 NOTE — Consult Note (Signed)
Chief Complaint:  Subjective/Chief Complaint Pt doing ok. Mild atypical cp . Doubt angina. Breathing better.   VITAL SIGNS/ANCILLARY NOTES: **Vital Signs.:   08-Jan-15 04:59  Vital Signs Type Routine  Temperature Temperature (F) 98  Celsius 36.6  Pulse Pulse 87  Respirations Respirations 18  Systolic BP Systolic BP 100  Diastolic BP (mmHg) Diastolic BP (mmHg) 66  Mean BP 77  Pulse Ox % Pulse Ox % 95  Pulse Ox Activity Level  At rest  Oxygen Delivery Room Air/ 21 %   Brief Assessment:  GEN well developed, well nourished, no acute distress   Cardiac Regular  murmur present  -- LE edema  -- JVD  --Gallop   Respiratory normal resp effort  clear BS  rhonchi   Gastrointestinal Normal   Gastrointestinal details normal Soft  Nontender  Nondistended  No masses palpable  Bowel sounds normal   EXTR negative cyanosis/clubbing, negative edema   Lab Results: LabObservation:  07-Jan-15 09:42   OBSERVATION Reason for Test  Cardiology:  07-Jan-15 09:42   Echo Doppler REASON FOR EXAM:     COMMENTS:     PROCEDURE: [facility] - ECHO DOPPLER COMPLETE(TRANSTHOR)  - Jan 06 2014  9:42AM   RESULT: Echocardiogram Report  Patient Name:   Lori Cooke Date of Exam: 01/06/2014 Medical Rec #:  [id number]       Custom1: Date of Birth:  07/02/1951    Height:       65.0 in Patient Age:    64 years     Weight:       187.4 lb Patient Gender: F            BSA:          1.92 m??  Indications: CHF Sonographer:    Cristela Blue RDCS Referring Phys: Altamese Dilling  Sonographer Comments: Echo stopped after apicals-per pt request- felt  like she was going to vomit.  Summary:  1. Left ventricular ejection fraction, by visual estimation, is 60 to  65%.  2. Normal global left ventricular systolic function.  3. Mildly dilated left atrium.  4. Mild mitral valve regurgitation.  5. Moderate thickening and calcification of the anterior and posterior  mitral valve leaflets.  6. Myxomatous mitral valve.  7.  Mildly elevated pulmonary artery systolic pressure.  8. Mild to moderate tricuspid regurgitation.  9. Moderately increased left ventricular posterior wall thickness. 2D AND M-MODE MEASUREMENTS (normal ranges within parentheses): Left Ventricle:          Normal IVSd (2D):      1.26 cm (0.7-1.1) LVPWd (2D):     1.40 cm (0.7-1.1) Aorta/LA:                  Normal LVIDd (2D):     4.51 cm (3.4-5.7) Aortic Root (2D): 3.50 cm (2.4-3.7) LVIDs (2D):     2.75 cm           Left Atrium (2D): 4.20 cm (1.9-4.0) LV FS (2D):     39.0 %   (>25%) LV EF (2D):     69.6 %   (>50%)                                   Right Ventricle:  RVd (2D):        2.45 cm LV DIASTOLIC FUNCTION: MV Peak E: 1.61 m/s E/e' Ratio: 12.40 MV Peak A: 1.18 m/s Decel Time: 335 msec E/A Ratio: 0.77 SPECTRAL DOPPLER ANALYSIS (where applicable): Mitral Valve: MV P1/2 Time: 97.15 msec MV Area, PHT: 2.26 cm?? Aortic Valve: AoV Max Vel: 2.80 m/s AoV Peak PG: 31.4 mmHg AoV Mean PG:  19.0 mmHg LVOT Vmax: 0.90 m/s LVOT VTI: 0.232 m LVOT Diameter: 2.10 cm AoV Area, Vmax: 1.11 cm?? AoV Area, VTI: 1.43 cm?? AoV Area, Vmn: 1.02 cm?? Tricuspid Valve and PA/RV Systolic Pressure: TR Max Velocity: 3.03 m/s RA  Pressure: 5 mmHg RVSP/PASP: 41.7 mmHg Pulmonic Valve: PV Max Velocity: 1.22 m/s PV Max PG: 6.0 mmHg PV Mean PG:  PHYSICIAN INTERPRETATION: Left Ventricle: The left ventricular internal cavity size was normal. LV  septal wall thickness was normal. LV posterior wall thickness was  moderately increased. Global LV systolic function was normal. Left  ventricular ejection fraction, by visual estimation, is 60 to 65%. Right Ventricle: The right ventricular size is normal. Global RV systolic  function is normal. Left Atrium: The left atrium is mildly dilated. Right Atrium: The right atrium is normal in size. Pericardium: There is no evidence of pericardial effusion. Mitral Valve: The mitral valve is  myxomatous. There is moderate  thickening and calcification of the anterior and posterior mitral valve  leaflets. Mild mitral valve regurgitation is seen. Can not r/o veg vs Ca#. Tricuspid Valve: The tricuspid valve is normal. Mild to moderate  tricuspid regurgitation is visualized. The tricuspid regurgitant velocity  is 3.03 m/s, and with an assumed right atrial pressure of 5 mmHg, the   estimated right ventricular systolic pressure is mildly elevated at 41.7  mmHg. Aortic Valve: The aortic valve is normal. No evidence of aortic valve  regurgitation is seen. Pulmonic Valve: The pulmonic valve is normal.  [id number] Idalys Konecny MD Electronically signed by [id number] Dorothyann Peng MD Signature Date/Time: 01/06/2014/11:14:43 AM  *** Final ***  IMPRESSION: .   Verified By: Alwyn Pea, M.D., MD  Routine Chem:  07-Jan-15 02:35   Result Comment TROPONIN - RESULTS VERIFIED BY REPEAT TESTING.  - PREV. C/ 01-05-14 @1820  BY TFK. AJO  Result(s) reported on 06 Jan 2014 at 03:30AM.  Cardiac:  07-Jan-15 02:35   CK, Total 23 (Result(s) reported on 06 Jan 2014 at 03:26AM.)  CPK-MB, Serum 1.6 (Result(s) reported on 06 Jan 2014 at 03:26AM.)  Troponin I  0.26 (0.00-0.05 0.05 ng/mL or less: NEGATIVE  Repeat testing in 3-6 hrs  if clinically indicated. >0.05 ng/mL: POTENTIAL  MYOCARDIAL INJURY. Repeat  testing in 3-6 hrs if  clinically indicated. NOTE: An increase or decrease  of 30% or more on serial  testing suggests a  clinically important change)    06:42   CK, Total 29 (Result(s) reported on 06 Jan 2014 at 07:33AM.)  CPK-MB, Serum 1.9 (Result(s) reported on 06 Jan 2014 at 07:33AM.)  Routine Coag:  07-Jan-15 02:35   Activated PTT (APTT)  54.3 (A HCT value >55% may artifactually increase the APTT. In one study, the increase was an average of 19%. Reference: "Effect on Routine and Special Coagulation Testing Values of Citrate Anticoagulant Adjustment in Patients with High HCT  Values." American Journal of Clinical Pathology 2006;126:400-405.)    10:47   Activated PTT (APTT)  57.9 (A HCT value >55% may artifactually increase the APTT. In one study, the increase was an average of 19%. Reference: "Effect on Routine and Special  Coagulation Testing Values of Citrate Anticoagulant Adjustment in Patients with High HCT Values." American Journal of Clinical Pathology 2006;126:400-405.)    18:45   Activated PTT (APTT)  64.9 (A HCT value >55% may artifactually increase the APTT. In one study, the increase was an average of 19%. Reference: "Effect on Routine and Special Coagulation Testing Values of Citrate Anticoagulant Adjustment in Patients with High HCT Values." American Journal of Clinical Pathology 2006;126:400-405.)   Radiology Results: XRay:    06-Jan-15 16:41, Chest Portable Single View  Chest Portable Single View   REASON FOR EXAM:    shortness of breath; chest pain  COMMENTS:       PROCEDURE: DXR - DXR PORTABLE CHEST SINGLE VIEW  - Jan 05 2014  4:41PM     CLINICAL DATA:  Shortness of breath.  Chest pain.  Fall.    EXAM:  PORTABLE CHEST - 1 VIEW    COMPARISON:DG CHEST 2V dated 12/13/2013; DG CHEST 2V dated  12/11/2013; DG CHEST 2V dated 12/07/2013; CT CHEST W/ CM dated  11/22/2008    FINDINGS:  Mild cardiomegaly with cardiothoracic index of 58%. Mildly tortuous  thoracic aorta. Prior median sternotomy.    The lungs appear clear. No pneumothorax. No pleural effusion  observed. 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     IMPRESSION:  1. Mildly enlarged cardiopericardial silhouette.  2. 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      Electronically Signed    By: Herbie BaltimoreWalt  Liebkemann M.D.    On: 01/05/2014 16:52     Verified By: Dellia CloudWALTER D. LIEBKEMANN, M.D.,    06-Jan-15 16:41, Forearm Right  Forearm Right   REASON FOR EXAM:    s/p fall; edema/pain  COMMENTS:       PROCEDURE: DXR - DXR FOREARM RIGHT  - Jan 05 2014  4:41PM     CLINICAL DATA:  Fall.  Forearm edema and pain.    EXAM:  RIGHT FOREARM - 2 VIEW    COMPARISON:  DG HAND COMPLETE 3+V*R* dated 01/05/2014    FINDINGS:  There is a fracture of the distal radius medially involving the  distal metaphysis and believed to be extending into the distal  radioulnar joint and possibly the distal radial articular surface  laterally.    I do not observe an ulnar fracture.  No elbow joint effusion.     IMPRESSION:  1. Fracture of the distal radius medially, likely extending into the  distal radioulnar joint and potentially the distal radial articular  surface.      Electronically Signed    By: Herbie BaltimoreWalt  Liebkemann M.D.    On: 01/05/2014 17:00     Verified By: Dellia CloudWALTER D. LIEBKEMANN, M.D.,    06-Jan-15 16:41, Hand Right Complete  Hand Right Complete   REASON FOR EXAM:    s/p fall; edema/pain  COMMENTS:       PROCEDURE: DXR - DXR HAND RT COMPLETE W/OBLIQUES  - Jan 05 2014  4:41PM     CLINICAL DATA:  Fall.  Edema and pain in the hand.    EXAM:  RIGHT HAND - COMPLETE 3+ VIEW    COMPARISON:  None.    FINDINGS:  Subtle distal radial fracture is just about occult on these  radiographs, but is better shown on the forearm radiographs obtained  concurrently. No additional fracture is observed.     IMPRESSION:  1. Distal radial fracture medially, with suspected extension into  the distal radial ulnar joint and  possibly the distal radial  articular surface. This fracture is nearly occult on this exam, but  is better shown on the dedicated forearm radiographs.      Electronically Signed    By: Herbie Baltimore M.D.    On: 01/05/2014 16:54         Verified By: Dellia Cloud, M.D.,    07-Jan-15 13:31, Chest PA and Lateral  Chest PA and Lateral   REASON FOR EXAM:    chf  COMMENTS:       PROCEDURE: DXR - DXR CHEST PA (OR AP) AND LATERAL  - Jan 06 2014  1:31PM     CLINICAL DATA:  History of CHF now with dyspnea    EXAM:  CHEST   2 VIEW    COMPARISON:  Portable chest x-ray January 05, 2014    FINDINGS:  The lungs are well-expanded. The interstitial markings are somewhat  coarse. The central pulmonary vascularity is prominent. On the  lateral film there are coarse lung markings in the retrocardiac  region likely bilaterally. A prominent crush-type prosthetic valve  in the aortic position is demonstrated. There is no significant  pleural effusion. The patient has undergone previous kyphoplasty for  a mid upper vertebral body compression. The patient has 5 intact  sternal wires demonstrated.     IMPRESSION:  The findings may reflect low-grade interstitial edema of cardiac  cause. Overall there has not been dramatic interval change since the  previous study. Atelectasis or developing pneumonia in the lower  lobes on the lateral film cannotbe excluded.      Electronically Signed    By: David  [place]    On: 01/06/2014 13:57         Verified By: DAVID A. [place], M.D., MD  Cardiology:    07-Jan-15 09:42, Echo Doppler  Echo Doppler   REASON FOR EXAM:      COMMENTS:       PROCEDURE: [id number] [facility] - ECHO DOPPLER COMPLETE(TRANSTHOR)  - Jan 06 2014  9:42AM     RESULT: Echocardiogram Report    Patient Name:   Lori Cooke Date of Exam: 01/06/2014  Medical Rec #:  [id number]       Custom1:  Date of Birth:  1951/01/14    Height:       65.0 in  Patient Age:    64 years     Weight:       187.4 lb  Patient Gender: F            BSA:          1.92 m??    Indications: CHF  Sonographer:    Cristela Blue RDCS  Referring Phys: Altamese Dilling    Sonographer Comments: Echo stopped after apicals-per pt request- felt   like she was going to vomit.    Summary:   1. Left ventricular ejection fraction, by visual estimation, is 60 to   65%.   2. Normal global left ventricular systolic function.   3. Mildly dilated left atrium.   4. Mild mitral valve regurgitation.   5. Moderate thickening and calcification of the anterior and  posterior   mitral valve leaflets.   6. Myxomatous mitral valve.   7. Mildly elevated pulmonary artery systolic pressure.   8. Mild to moderate tricuspid regurgitation.   9. Moderately increased left ventricular posterior wall thickness.  2D AND M-MODE MEASUREMENTS (normal ranges within parentheses):  Left Ventricle:          Normal  IVSd (2D):      1.26 cm (0.7-1.1)  LVPWd (2D):     1.40 cm (0.7-1.1) Aorta/LA:                  Normal  LVIDd (2D):     4.51 cm (3.4-5.7) Aortic Root (2D): 3.50 cm (2.4-3.7)  LVIDs (2D):     2.75 cm           Left Atrium (2D): 4.20 cm (1.9-4.0)  LV FS (2D):     39.0 %   (>25%)  LV EF (2D):     69.6 %   (>50%)                                    Right Ventricle:                                    RVd (2D):        2.45 cm  LV DIASTOLIC FUNCTION:  MV Peak E: 0.90 m/s E/e' Ratio: 12.40  MV Peak A: 1.18 m/s Decel Time: 335 msec  E/A Ratio: 0.77  SPECTRAL DOPPLER ANALYSIS (where applicable):  Mitral Valve:  MV P1/2 Time: 97.15 msec  MV Area, PHT: 2.26 cm??  Aortic Valve: AoV Max Vel: 2.80 m/s AoV Peak PG: 31.4 mmHg AoV Mean PG:   19.0 mmHg  LVOT Vmax: 0.90 m/s LVOT VTI: 0.232 m LVOT Diameter: 2.10 cm  AoV Area, Vmax: 1.11 cm?? AoV Area, VTI: 1.43 cm?? AoV Area, Vmn: 1.02 cm??  Tricuspid Valve and PA/RV Systolic Pressure: TR Max Velocity: 3.03 m/s RA   Pressure: 5 mmHg RVSP/PASP: 41.7 mmHg  Pulmonic Valve:  PV Max Velocity: 1.22 m/s PV Max PG: 6.0 mmHg PV Mean PG:    PHYSICIAN INTERPRETATION:  Left Ventricle: The left ventricular internal cavity size was normal. LV   septal wall thickness was normal. LV posterior wall thickness was   moderately increased. Global LV systolic function was normal. Left   ventricular ejection fraction, by visual estimation, is 60 to 65%.  Right Ventricle: The right ventricular size is normal. Global RV systolic   function is normal.  Left Atrium: The left atrium is mildly dilated.  Right Atrium: The right atrium is normal in  size.  Pericardium: There is no evidence of pericardial effusion.  Mitral Valve: The mitral valve is myxomatous. There is moderate   thickening and calcification of the anterior and posterior mitral valve   leaflets. Mild mitral valve regurgitation is seen. Can not r/o veg vs Ca#.  Tricuspid Valve: The tricuspid valve is normal. Mild to moderate   tricuspid regurgitation is visualized. The tricuspid regurgitant velocity   is 3.03 m/s, and with an assumed right atrial pressure of 5 mmHg, the     estimated right ventricular systolic pressure is mildly elevated at 41.7   mmHg.  Aortic Valve: The aortic valve is normal. No evidence of aortic valve   regurgitation is seen.  Pulmonic Valve: The pulmonic valve is normal.    1105 Orene Abbasi MD  Electronically signed by [id number] Dorothyann Peng MD  Signature Date/Time: 01/06/2014/11:14:43 AM    *** Final ***    IMPRESSION: .      Verified By: Alwyn Pea, M.D., MD   Assessment/Plan:  Invasive Device Daily Assessment of Necessity:  Does the patient currently have any  of the following indwelling devices? none   Assessment/Plan:  Assessment IMP SOB COPD HTN AoVR Bacteremia Anemia Elevated troponin .   Plan PLAN D/C Heparin Hold ASA Agree with transfuse 8/24 Agree with Antibx IV Doubt MI Increas activity Inhalers for COPD 02 [place] supplemental Consider GI w/u   Electronic Signatures: Alwyn Pea (MD)  (Signed 08-Jan-15 13:28)  Authored: Chief Complaint, VITAL SIGNS/ANCILLARY NOTES, Brief Assessment, Lab Results, Radiology Results, Assessment/Plan   Last Updated: 08-Jan-15 13:28 by Dorothyann Peng D (MD)

## 2015-04-23 NOTE — Consult Note (Signed)
Brief Consult Note: Diagnosis: Right distal radius fracture, non-displaced.   Patient was seen by consultant.   Recommend further assessment or treatment.   Comments: Patient is a 64 year old female who fell last week and injured her right wrist and forearm.  She denies any other injuries.  She complains of right wrist pain currently at the beside.  She is in a sugar tong splint.  She denies any numbness or tingling.  I have reviewed the patient's medical history from the admission H&P and the EMR.  On examination of her right arm, her skin is intact and there is no erythema, ecchymosis or swelling.  Her forearm compartments are soft and compressible.  She can flex and extend all digits and wrist with 60 degrees of flexion and extension without pain.  She can pronate and supinate to 80 degrees.  She has a palpable radial pulses and has point tenderness over the radial styloid.  I reviewed the xrays of the wrist and forearm from the ER.  These films demonstrate a non-displaced fracture of the distal radius involving the radial styloid extending into the articular surface.  I removed the sugar tong splint tonight at the bedside and applied a short arm cast.  The patient should follow up with me in 4 weeks for removal of her cast.  Electronic Signatures: Juanell FairlyKrasinski, Aura Bibby (MD)  (Signed 07-Jan-15 19:24)  Authored: Brief Consult Note   Last Updated: 07-Jan-15 19:24 by Juanell FairlyKrasinski, Abrianna Sidman (MD)

## 2015-04-23 NOTE — Consult Note (Signed)
Brief Consult Note: Diagnosis: SOB/Elevated troponins/HypoK+.   Patient was seen by consultant.   Consult note dictated.   Recommend further assessment or treatment.   Orders entered.   Discussed with Attending MD.   Comments: IMP SOB Elevated troponins Angina? Smoking COPD AoVR pig HypoK+ Bacteremia CAD Fx Arm . PLAN F/U troponins Tele Shortterm anticoug Inhalers Advise to quit smoking ECHO sob/murmur/AoVR Correct K+ Antibx for bacteremia Statin for lipids Mild lasix for diuresis No cardiac cath for now.  Electronic Signatures: Dorothyann Pengallwood, Dwayne D (MD)  (Signed 07-Jan-15 14:17)  Authored: Brief Consult Note   Last Updated: 07-Jan-15 14:17 by Dorothyann Pengallwood, Dwayne D (MD)

## 2015-04-23 NOTE — Consult Note (Signed)
Chief Complaint:  Subjective/Chief Complaint Feels better. No able to ambulate on her own. Denies f/c/s. No rash. SOB improved   VITAL SIGNS/ANCILLARY NOTES: **Vital Signs.:   15-Jan-15 11:30  Vital Signs Type Routine  Temperature Temperature (F) 97.1  Celsius 36.1  Temperature Source oral  Pulse Pulse 78  Respirations Respirations 18  Systolic BP Systolic BP 118  Diastolic BP (mmHg) Diastolic BP (mmHg) 77  Mean BP 90  Pulse Ox % Pulse Ox % 97  Pulse Ox Activity Level  At rest  Oxygen Delivery 2L  *Intake and Output.:   15-Jan-15 12:57  Grand Totals Intake:  100 Output:      Net:  100 24 Hr.:  -400  IV (Secondary)      In:  100   Brief Assessment:  GEN well developed, well nourished, no acute distress   Cardiac Regular  murmur present  -- LE edema  -- JVD  --Gallop   Respiratory normal resp effort  clear BS  rhonchi   Gastrointestinal Normal   Gastrointestinal details normal Soft  Nontender  Nondistended  No masses palpable  Bowel sounds normal   EXTR negative cyanosis/clubbing, negative edema   Lab Results: Routine Chem:  14-Jan-15 03:22   Result Comment labs - This specimen was collected through an   - indwelling catheter or arterial line.  - A minimum of of blood was wasted prior    - to collecting the sample.  Interpret  - results with caution. DIFFERENTIAL - SMEAR SCANNED  Result(s) reported on 13 Jan 2014 at 04:16AM.  Routine Hem:  14-Jan-15 03:22   WBC (CBC)  17.6  RBC (CBC)  2.79  Hemoglobin (CBC)  8.2  Hematocrit (CBC)  24.4  Platelet Count (CBC) 289  MCV 87  MCH 29.4  MCHC 33.7  RDW 14.5  Segmented Neutrophils 86  Lymphocytes 11  Monocytes 2  Myelocyte 1  Diff Comment 1 ANISOCYTOSIS  Diff Comment 2 POLYCHROMASIA  Diff Comment 3 LARGE PLATELETS  Diff Comment 4 PLTS VARIED IN SIZE  Result(s) reported on 13 Jan 2014 at 04:16AM.   Radiology Results: XRay:    06-Jan-15 16:41, Chest Portable Single View  Chest Portable Single View    REASON FOR EXAM:    shortness of breath; chest pain  COMMENTS:       PROCEDURE: DXR - DXR PORTABLE CHEST SINGLE VIEW  - Jan 05 2014  4:41PM     CLINICAL DATA:  Shortness of breath.  Chest pain.  Fall.    EXAM:  PORTABLE CHEST - 1 VIEW    COMPARISON:DG CHEST 2V dated 12/13/2013; DG CHEST 2V dated  12/11/2013; DG CHEST 2V dated 12/07/2013; CT CHEST W/ CM dated  11/22/2008    FINDINGS:  Mild cardiomegaly with cardiothoracic index of 58%. Mildly tortuous  thoracic aorta. Prior median sternotomy.    The lungs appear clear. No pneumothorax. No pleural effusion  observed. 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     IMPRESSION:  1. Mildly enlarged cardiopericardial silhouette.  2. 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      Electronically Signed    By: Herbie Baltimore M.D.    On: 01/05/2014 16:52     Verified By: Dellia Cloud, M.D.,    06-Jan-15 16:41, Forearm Right  Forearm Right   REASON FOR EXAM:    s/p fall; edema/pain  COMMENTS:       PROCEDURE: DXR - DXR FOREARM RIGHT  - Jan 05 2014  4:41PM     CLINICAL DATA:  Fall.  Forearm edema and pain.    EXAM:  RIGHT FOREARM - 2 VIEW    COMPARISON:  DG HAND COMPLETE 3+V*R* dated 01/05/2014    FINDINGS:  There is a fracture of the distal radius medially involving the  distal metaphysis and believed to be extending into the distal  radioulnar joint and possibly the distal radial articular surface  laterally.    I do not observe an ulnar fracture.  No elbow joint effusion.     IMPRESSION:  1. Fracture of the distal radius medially, likely extending into the  distal radioulnar joint and potentially the distal radial articular  surface.      Electronically Signed    By: Herbie BaltimoreWalt  Liebkemann M.D.    On: 01/05/2014 17:00     Verified By: Dellia CloudWALTER D. LIEBKEMANN, M.D.,    06-Jan-15 16:41, Hand Right Complete  Hand Right Complete   REASON FOR EXAM:    s/p fall;  edema/pain  COMMENTS:       PROCEDURE: DXR - DXR HAND RT COMPLETE W/OBLIQUES  - Jan 05 2014  4:41PM     CLINICAL DATA:  Fall.  Edema and pain in the hand.    EXAM:  RIGHT HAND - COMPLETE 3+ VIEW    COMPARISON:  None.    FINDINGS:  Subtle distal radial fracture is just about occult on these  radiographs, but is better shown on the forearm radiographs obtained  concurrently. No additional fracture is observed.     IMPRESSION:  1. Distal radial fracture medially, with suspected extension into  the distal radial ulnar joint and possibly the distal radial  articular surface. This fracture is nearly occult on this exam, but  is better shown on the dedicated forearm radiographs.      Electronically Signed    By: Herbie BaltimoreWalt  Liebkemann M.D.    On: 01/05/2014 16:54         Verified By: Dellia CloudWALTER D. LIEBKEMANN, M.D.,    07-Jan-15 13:31, Chest PA and Lateral  Chest PA and Lateral   REASON FOR EXAM:    chf  COMMENTS:       PROCEDURE: DXR - DXR CHEST PA (OR AP) AND LATERAL  - Jan 06 2014  1:31PM     CLINICAL DATA:  History of CHF now with dyspnea    EXAM:  CHEST  2 VIEW    COMPARISON:  Portable chest x-ray January 05, 2014    FINDINGS:  The lungs are well-expanded. The interstitial markings are somewhat  coarse. The central pulmonary vascularity is prominent. On the  lateral film there are coarse lung markings in the retrocardiac  region likely bilaterally. A prominent crush-type prosthetic valve  in the aortic position is demonstrated. There is no significant  pleural effusion. The patient has undergone previous kyphoplasty for  a mid upper vertebral body compression. The patient has 5 intact  sternal wires demonstrated.     IMPRESSION:  The findings may reflect low-grade interstitial edema of cardiac  cause. Overall there has not been dramatic interval change since the  previous study. Atelectasis or developing pneumonia in the lower  lobes on the lateral film cannotbe  excluded.      Electronically Signed    By: David  SwazilandJordan    On: 01/06/2014 13:57         Verified By: DAVID A. SwazilandJORDAN, M.D., MD    09-Jan-15 19:22, Knee Right Complete  Knee  Right Complete   REASON FOR EXAM:    s/p fall and patient c/o right knee pain  COMMENTS:       PROCEDURE: DXR - DXR KNEE RT COMP WITH OBLIQUES  - Jan 08 2014  7:22PM     CLINICAL DATA:  Fall with right knee pain.    EXAM:  RIGHT KNEE - COMPLETE 4+ VIEW    COMPARISON: None    FINDINGS:  There is no evidence of acute fracture, subluxation or dislocation.  There is no evidence of joint effusion.    Mild sclerotic irregularity of the proximal tibia may represent an  infarct.    No other focal bony abnormalities are present.    The joint spaces are otherwise unremarkable.     IMPRESSION:  No evidence of acute abnormality.      Electronically Signed    By: Laveda Abbe M.D.    On: 01/08/2014 19:37         Verified By: Rosendo Gros, M.D.,    10-Jan-15 15:47, Chest 1 View AP or PA  Chest 1 View AP or PA   REASON FOR EXAM:    verify PICC placement correct  COMMENTS:       PROCEDURE: DXR - DXR CHEST 1 VIEWAP OR PA  - Jan 09 2014  3:47PM     CLINICAL DATA:  PICC line placement    EXAM:  CHEST - 1 VIEW    COMPARISON:  01/06/2014    FINDINGS:  Cardiomediastinal silhouette is stable. Mild interstitial prominence  bilaterally again noted. There is left arm PICC line with tip in SVC  right atrium junction. No diagnostic pneumothorax. No segmental  infiltrate.     IMPRESSION:  Left arm PICC line with tip in SVC right atrium junction. No  diagnostic pneumothorax.      Electronically Signed    By: Natasha Mead M.D.    On: 01/09/2014 15:58         Verified By: Kennieth Francois, M.D.,  Cardiology:    06-Jan-15 16:18, ED ECG  Ventricular Rate 93  Atrial Rate 93  P-R Interval 138  QRS Duration 84  QT 360  QTc 447  P Axis 71  R Axis 60  T Axis 69  ECG interpretation   Normal sinus  rhythm  Possible Left atrial enlargement  ST & T wave abnormality, consider anterior ischemia  Abnormal ECG  When compared with ECG of 07-Dec-2013 10:47,  Non-specific change in ST segment in Inferior leads  T wave inversion now evident in Anterior leads  ----------unconfirmed----------  Confirmed by OVERREAD, NOT ([id number]), editor PEARSON, BARBARA ([id number]) on 01/08/2014 8:39:01 AM  ED ECG     07-Jan-15 09:42, Echo Doppler  Echo Doppler   REASON FOR EXAM:      COMMENTS:       PROCEDURE: [facility] - ECHO DOPPLER COMPLETE(TRANSTHOR)  - Jan 06 2014  9:42AM     RESULT: Echocardiogram Report    Patient Name:   Lori Cooke Date of Exam: 01/06/2014  Medical Rec #:  [id number]       Custom1:  Date of Birth:  19-Aug-1951    Height:       65.0 in  Patient Age:    64 years     Weight:       187.4 lb  Patient Gender: F            BSA:          1.92  m??    Indications: CHF  Sonographer:    Cristela Blue RDCS  Referring Phys: Altamese Dilling    Sonographer Comments: Echo stopped after apicals-per pt request- felt   like she was going to vomit.    Summary:   1. Left ventricular ejection fraction, by visual estimation, is 60 to   65%.   2. Normal global left ventricular systolic function.   3. Mildly dilated left atrium.   4. Mild mitral valve regurgitation.   5. Moderate thickening and calcification of the anterior and posterior   mitral valve leaflets.   6. Myxomatous mitral valve.   7. Mildly elevated pulmonary artery systolic pressure.   8. Mild to moderate tricuspid regurgitation.   9. Moderately increased left ventricular posterior wall thickness.  2D AND M-MODE MEASUREMENTS (normal ranges within parentheses):  Left Ventricle:          Normal  IVSd (2D):      1.26 cm (0.7-1.1)  LVPWd (2D):     1.40 cm (0.7-1.1) Aorta/LA:                  Normal  LVIDd (2D):     4.51 cm (3.4-5.7) Aortic Root (2D): 3.50 cm (2.4-3.7)  LVIDs (2D):     2.75 cm           Left Atrium (2D): 4.20 cm (1.9-4.0)  LV FS  (2D):     39.0 %   (>25%)  LV EF (2D):     69.6 %   (>50%)                                    Right Ventricle:                                    RVd (2D):        2.45 cm  LV DIASTOLIC FUNCTION:  MV Peak E: 0.90 m/s E/e' Ratio: 12.40  MV Peak A: 1.18 m/s Decel Time: 335 msec  E/A Ratio: 0.77  SPECTRAL DOPPLER ANALYSIS (where applicable):  Mitral Valve:  MV P1/2 Time: 97.15 msec  MV Area, PHT: 2.26 cm??  Aortic Valve: AoV Max Vel: 2.80 m/s AoV Peak PG: 31.4 mmHg AoV Mean PG:   19.0 mmHg  LVOT Vmax: 0.90 m/s LVOT VTI: 0.232 m LVOT Diameter: 2.10 cm  AoV Area, Vmax: 1.11 cm?? AoV Area, VTI: 1.43 cm?? AoV Area, Vmn: 1.02 cm??  Tricuspid Valve and PA/RV Systolic Pressure: TR Max Velocity: 3.03 m/s RA   Pressure: 5 mmHg RVSP/PASP: 41.7 mmHg  Pulmonic Valve:  PV Max Velocity: 1.22 m/s PV Max PG: 6.0 mmHg PV Mean PG:    PHYSICIAN INTERPRETATION:  Left Ventricle: The left ventricular internal cavity size was normal. LV   septal wall thickness was normal. LV posterior wall thickness was   moderately increased. Global LV systolic function was normal. Left   ventricular ejection fraction, by visual estimation, is 60 to 65%.  Right Ventricle: The right ventricular size is normal. Global RV systolic   function is normal.  Left Atrium: The left atrium is mildly dilated.  Right Atrium: The right atrium is normal in size.  Pericardium: There is no evidence of pericardial effusion.  Mitral Valve: The mitral valve is myxomatous. There is moderate   thickening and calcification of the anterior and posterior mitral valve  leaflets. Mild mitral valve regurgitation is seen. Can not r/o veg vs Ca#.  Tricuspid Valve: The tricuspid valve is normal. Mild to moderate   tricuspid regurgitation is visualized. The tricuspid regurgitant velocity   is 3.03 m/s, and with an assumed right atrial pressure of 5 mmHg, the     estimated right ventricular systolic pressure is mildly elevated at 41.7   mmHg.  Aortic  Valve: The aortic valve is normal. No evidence of aortic valve   regurgitation is seen.  Pulmonic Valve: The pulmonic valve is normal.    [id number] Dwayne Callwood MD  Electronically signed by [id number] Dorothyann Peng MD  Signature Date/Time: 01/06/2014/11:14:43 AM    *** Final ***    IMPRESSION: .      Verified By: Alwyn Pea, M.D., MD    13-Jan-15 08:10, TEE  TEE   REASON FOR EXAM:      COMMENTS:       PROCEDURE: [facility] - ECHO TRANSESOPHAGEAL  - Jan 12 2014  8:10AM     RESULT: Transesophageal Echocardiogram Report    Patient Name:   Lori Cooke Date of Exam: 01/12/2014  Medical Rec #:  [id number]       Custom1:  Date of Birth:  20-Sep-1951    Height:  Patient Age:    64 years     Weight:  Patient Gender: F            BSA:    Indications: Endocarditis  Sonographer:    Cristela Blue RDCS  Referring Phys: Altamese Dilling  SPECTRAL DOPPLER ANALYSIS (where applicable):    PROCEDURE: After discussion of the risks and benefits of the TEE, an   informed consent was obtained. Local oropharyngeal anesthetic was   provided with Benzocaine spray and viscous lidocaine. Intravenous   conscious sedation was provided with 6mg  Midazolam and 125 mcg Fentanyl.   The TEE probe was passed without difficulty. Imaged were obtained with   the patient in a left lateral decubitus position. Image quality was   excellent. The patient's vital signs; including heart rate, blood   pressure, and oxygen saturation; remained stable throughout the   procedure. The patient tolerated the procedure well and without   complications.    PHYSICIAN INTERPRETATION:  Left Ventricle: LV septal wall thickness was normal. LV posterior wall   thickness was normal. Global LV systolic function was normal. Left   ventricular ejection fraction, by visual estimation, is 60 to 65%.  Left Atrium: The left atrium is normal in size.  Right Atrium: The right atrium is normal in size.  Mitral Valve: A pedunculated and mobile and  moderate vegetation is seen   on the anterior mitral leaflet. The MV vegetation measures 1 mm x 1 mm.   Mild mitral valve regurgitation is seen. The MR jet is centrally-directed.  Aortic Valve: Echo findings are consistent with vegetation of the aortic   prosthesis.  Shunts: Agitated saline contrast was given intravenously to evaluate for   intracardiac shunting. Saline contrast bubble study was negative, with no   evidence of any intracardiac shunt.    Summary:   1. Left ventricular ejection fraction, by visual estimation, is 60 to   65%.   2. Normal global left ventricular systolic function.   3. Mild mitral valve regurgitation.   4. Pedunculated and mobile and moderate vegetation on the mitral valve.   5. Abnormalvegetation of the aortic prosthesis.   6. 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   7. Saline contrast bubble study  was negative, with no evidence of any   intracardiac shunt.  [id number] Harold Hedge MD  Electronically signed by [id number] Harold Hedge MD  Signature Date/Time: 01/12/2014/4:52:43 PM  *** Final ***    IMPRESSION: .    Verified By: Dalia Heading, M.D., MD  CT:    08-Jan-15 15:40, CT Abdomen and Pelvis Without Contrast  CT Abdomen and Pelvis Without Contrast   REASON FOR EXAM:    (1) drop in hgb and lower abd pain. eval for intraabd   bleed; (2) drop in hgb and  COMMENTS:       PROCEDURE: CT  - CT ABDOMEN AND PELVIS W0  - Jan 07 2014  3:40PM     CLINICAL DATA:  On set of lower abdominal discomfort today, drop in  hemoglobin level    EXAM:  CT ABDOMEN AND PELVIS WITHOUT CONTRAST    TECHNIQUE:  Multidetector CT imaging of the abdomen and pelvis was performed  following the standard protocol without intravenous contrast.  COMPARISON:  None.    FINDINGS:  The liver exhibits no focal mass nor ductal dilation. The  gallbladder is surgically absent. The spleen exhibits normal density  and contour and does not exceed 11.2 cm in greatest dimension.  The  pancreas, adrenal glands, and kidneys exhibit no acute  abnormalities. The left kidney is atrophic and exhibits several 1-2  mm diameter nonobstructing upper pole stones. There is a 2 mm  diameter midpole nonobstructing stone on the right. There is a small  amount of increased density in the perinephric fat on the right  without evidence of a perinephric hematoma. There is no free fluid  in the abdomen or pelvis. The psoas musculature is normal in density  and contour with no evidence of a hematoma.  The abdominal aorta exhibits failure to taper of its caliber. There  is mural calcification but no evidence of an aneurysm. The patient  has undergone stent placement in the proximal common iliac arteries  bilaterally. There is no evidence of periaortic or peri-iliac  hemorrhage.    The stomach is nondistended. The non-opacified loops of small and  large bowel exhibit no evidence of ileus nor of obstruction. There  is sigmoid diverticulosis without evidence of acute diverticulitis.  The partially distended urinary bladder is normal in appearance.The  uterus is surgically absent. No adnexal masses are demonstrated.  There is no inguinal nor significant umbilical hernia.    The lung bases exhibit mild compressive atelectasis posteriorly with  small amounts of pleural thickening or fluid posteriorly. The lumbar  vertebral bodies are preserved in height. There is disc space  narrowing at L4-5 with posterior hard disc bulging. The bony pelvis  exhibits no evidence of an acute fracture nor lytic or blastic  lesion. There is mild degenerative change of the right hip.    There is no evidence of a subcutaneous or deeper hematoma. Minimally  increased density in the subcutaneous fat over the hips bilaterally  is nonspecific.     IMPRESSION:  1. There is no evidence of an intraperitoneal or retroperitoneal  hemorrhage. No significant abnormality within the subcutaneous or  deeper soft  tissues over the abdomen or pelvis is demonstrated.  2. There is no evidence of acute hepatobiliary abnormality.  3. There are chronic changes within the kidneys with left renal  atrophy and bilateral nonobstructing stones. Mildly increased  density in the perinephric fat on the right is nonspecific. There is  no evidence of obstruction.  4.  There is diverticulosis of the sigmoid colon but there is no  evidence of bowel obstruction or ileus.  5. There is mild atelectasis and minimal pleural thickening/fluid in  the posterior costophrenic gutters bilaterally.      Electronically Signed    By: David  [place]aziland    On: 01/07/2014 15:56         Verified By: DAVID A. [place], M.D., MD   Assessment/Plan:  Invasive Device Daily Assessment of Necessity:  Does the patient currently have any of the following indwelling devices? none   Assessment/Plan:  Assessment IMP Sepsis S/P TEE/ veg Aypical CP SOB COPD HTN AoVR Bacteremia Anemia Elevated troponin .   Plan PLAN Long term antiibx for 6-8 weeks Long term antibx IV for veg on bioprosthetic valve Agree with pic line Antbx for pul infection Agree with transfusion as necessary D/C Heparin Hold ASA for now Agree with transfuse 8/24 Agree with Antibx IV Doubt MI Increas activity Inhalers for COPD 02 [place] supplemental Consider GI w/u No clear indiction for valve surgery at this ppoint   Electronic Signatures: Dorothyann Peng D (MD)  (Signed 16-Jan-15 00:21)  Authored: Chief Complaint, VITAL SIGNS/ANCILLARY NOTES, Brief Assessment, Lab Results, Radiology Results, Assessment/Plan   Last Updated: 16-Jan-15 00:21 by Dorothyann Peng D (MD)

## 2018-10-31 DEATH — deceased
